# Patient Record
Sex: Female | Born: 1970 | Race: White | Hispanic: No | Marital: Single | Smoking: Never smoker
Health system: Southern US, Community
[De-identification: ages and names within clinical notes are randomized; demographics above are authoritative.]

## PROBLEM LIST (undated history)

## (undated) ENCOUNTER — Emergency Department (HOSPITAL_BASED_OUTPATIENT_CLINIC_OR_DEPARTMENT_OTHER): Admission: EM | Payer: BC Managed Care – PPO | Source: Home / Self Care

## (undated) DIAGNOSIS — T4145XA Adverse effect of unspecified anesthetic, initial encounter: Secondary | ICD-10-CM

## (undated) DIAGNOSIS — Z9889 Other specified postprocedural states: Secondary | ICD-10-CM

## (undated) DIAGNOSIS — Z789 Other specified health status: Secondary | ICD-10-CM

## (undated) DIAGNOSIS — R112 Nausea with vomiting, unspecified: Secondary | ICD-10-CM

---

## 2000-05-23 ENCOUNTER — Other Ambulatory Visit: Admission: RE | Admit: 2000-05-23 | Discharge: 2000-05-23 | Payer: Self-pay | Admitting: Obstetrics and Gynecology

## 2001-05-10 ENCOUNTER — Other Ambulatory Visit: Admission: RE | Admit: 2001-05-10 | Discharge: 2001-05-10 | Payer: Self-pay | Admitting: Obstetrics and Gynecology

## 2002-05-27 ENCOUNTER — Encounter: Payer: Self-pay | Admitting: Obstetrics and Gynecology

## 2002-05-27 ENCOUNTER — Inpatient Hospital Stay (HOSPITAL_COMMUNITY): Admission: AD | Admit: 2002-05-27 | Discharge: 2002-05-27 | Payer: Self-pay | Admitting: Obstetrics and Gynecology

## 2002-06-14 ENCOUNTER — Inpatient Hospital Stay (HOSPITAL_COMMUNITY): Admission: AD | Admit: 2002-06-14 | Discharge: 2002-06-17 | Payer: Self-pay | Admitting: Obstetrics and Gynecology

## 2002-07-26 ENCOUNTER — Other Ambulatory Visit: Admission: RE | Admit: 2002-07-26 | Discharge: 2002-07-26 | Payer: Self-pay | Admitting: Obstetrics and Gynecology

## 2002-08-08 ENCOUNTER — Encounter: Admission: RE | Admit: 2002-08-08 | Discharge: 2002-08-08 | Payer: Self-pay | Admitting: Urology

## 2002-08-08 ENCOUNTER — Encounter: Payer: Self-pay | Admitting: Urology

## 2002-09-02 ENCOUNTER — Encounter: Payer: Self-pay | Admitting: Urology

## 2002-09-02 ENCOUNTER — Encounter: Admission: RE | Admit: 2002-09-02 | Discharge: 2002-09-02 | Payer: Self-pay | Admitting: Urology

## 2003-06-18 ENCOUNTER — Other Ambulatory Visit: Admission: RE | Admit: 2003-06-18 | Discharge: 2003-06-18 | Payer: Self-pay | Admitting: Obstetrics and Gynecology

## 2003-07-15 ENCOUNTER — Emergency Department (HOSPITAL_COMMUNITY): Admission: AC | Admit: 2003-07-15 | Discharge: 2003-07-15 | Payer: Self-pay

## 2003-07-19 ENCOUNTER — Emergency Department (HOSPITAL_COMMUNITY): Admission: AD | Admit: 2003-07-19 | Discharge: 2003-07-19 | Payer: Self-pay | Admitting: Family Medicine

## 2004-09-05 DIAGNOSIS — T8859XA Other complications of anesthesia, initial encounter: Secondary | ICD-10-CM

## 2004-09-05 HISTORY — DX: Other complications of anesthesia, initial encounter: T88.59XA

## 2004-11-23 ENCOUNTER — Ambulatory Visit (HOSPITAL_COMMUNITY): Admission: RE | Admit: 2004-11-23 | Discharge: 2004-11-23 | Payer: Self-pay | Admitting: Obstetrics and Gynecology

## 2004-12-13 ENCOUNTER — Other Ambulatory Visit: Admission: RE | Admit: 2004-12-13 | Discharge: 2004-12-13 | Payer: Self-pay | Admitting: Obstetrics and Gynecology

## 2005-06-30 ENCOUNTER — Inpatient Hospital Stay (HOSPITAL_COMMUNITY): Admission: AD | Admit: 2005-06-30 | Discharge: 2005-07-03 | Payer: Self-pay | Admitting: Obstetrics and Gynecology

## 2005-06-30 ENCOUNTER — Encounter (INDEPENDENT_AMBULATORY_CARE_PROVIDER_SITE_OTHER): Payer: Self-pay | Admitting: *Deleted

## 2005-07-07 ENCOUNTER — Inpatient Hospital Stay (HOSPITAL_COMMUNITY): Admission: AD | Admit: 2005-07-07 | Discharge: 2005-07-07 | Payer: Self-pay | Admitting: Obstetrics and Gynecology

## 2005-08-18 ENCOUNTER — Ambulatory Visit (HOSPITAL_COMMUNITY): Admission: RE | Admit: 2005-08-18 | Discharge: 2005-08-18 | Payer: Self-pay | Admitting: Obstetrics and Gynecology

## 2005-10-05 IMAGING — US US OB TRANSVAGINAL MODIFY
1 series · 14 of 28 positions shown · non-contrast
Comparison: none

CLINICAL DATA: G3 P1 TAB1.  LMP 10/04/04.  Spotting.  Non-reassuring quantitative beta hCG levels.  
 EARLY OBSTETRICAL ULTRASOUND WITH TRANSVAGINAL:  
 Transabdominal and transvaginal scanning of the pelvis was performed. 
 There is an intrauterine gestational sac containing a single living embryo with a regular heart rate of 126 bpm.  By crown rump length, the gestation is estimated at 6 weeks 4 days.  This is 4 days behind LMP dating.  The measurement variation at this gestational age is + / - 4 days.  Yolk sac is visualized.  No subchorionic hemorrhage is noted.
 Ovaries are normal.  No adnexal mass or free pelvic fluid is noted.

[Series 1: us ob transvaginal modify · 0.27mm/px · 14 of 40 slices shown]
[im 2/40]
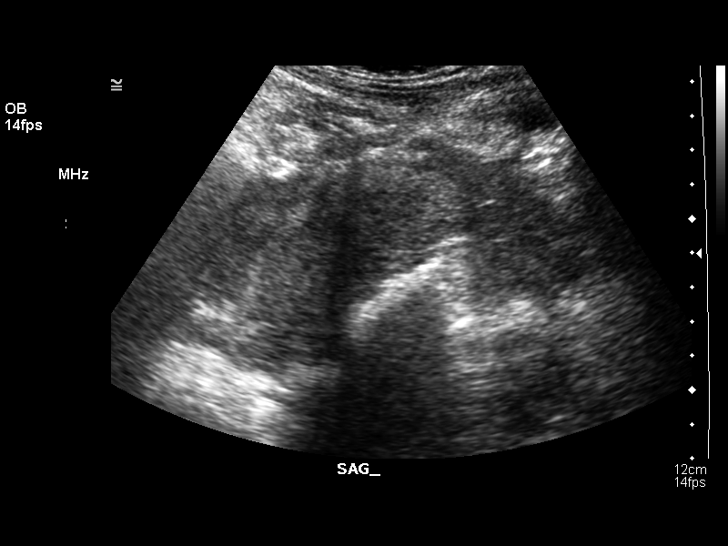
[im 5/40]
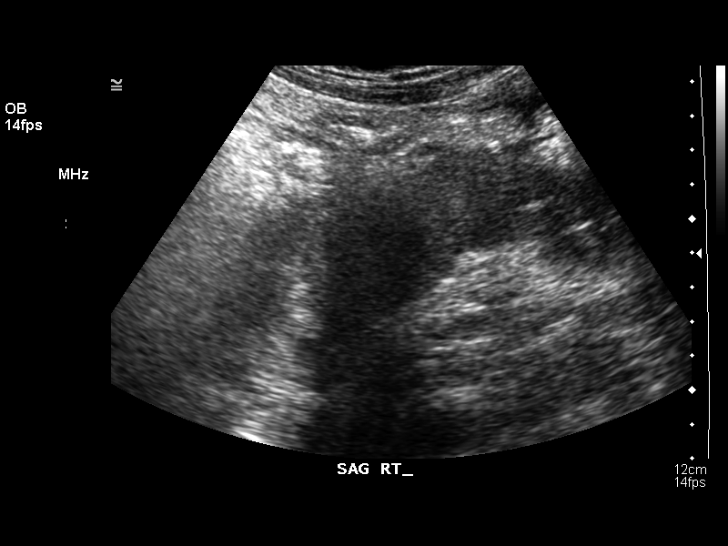
[im 8/40]
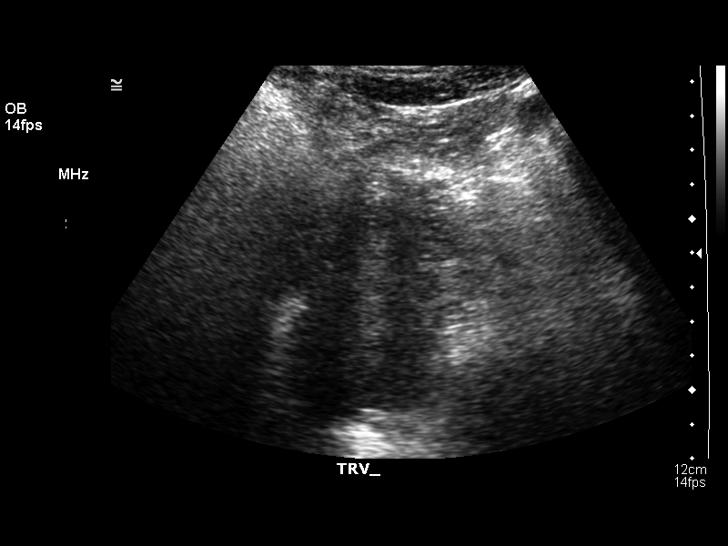
[im 11/40]
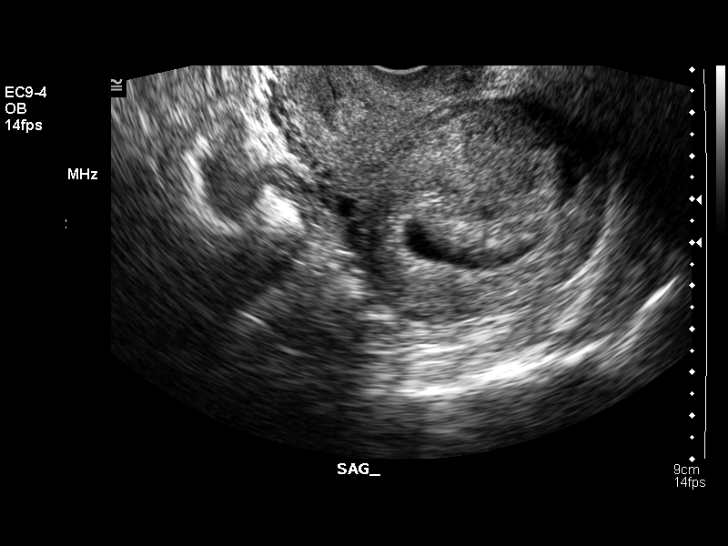
[im 14/40]
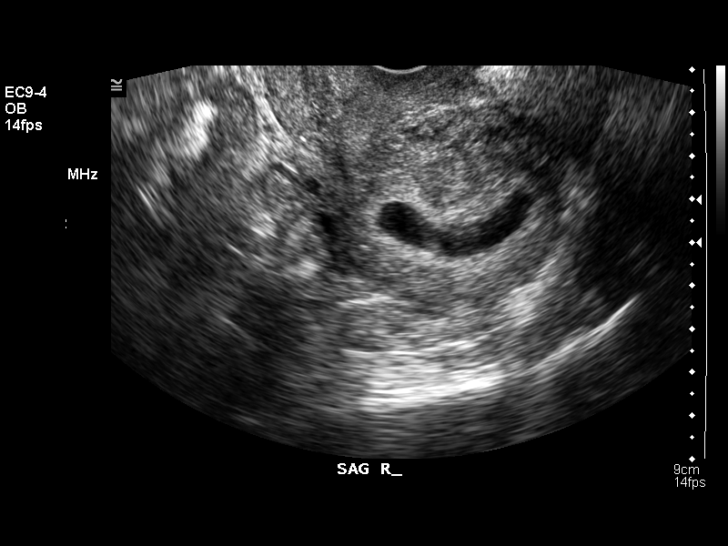
[im 16/40]
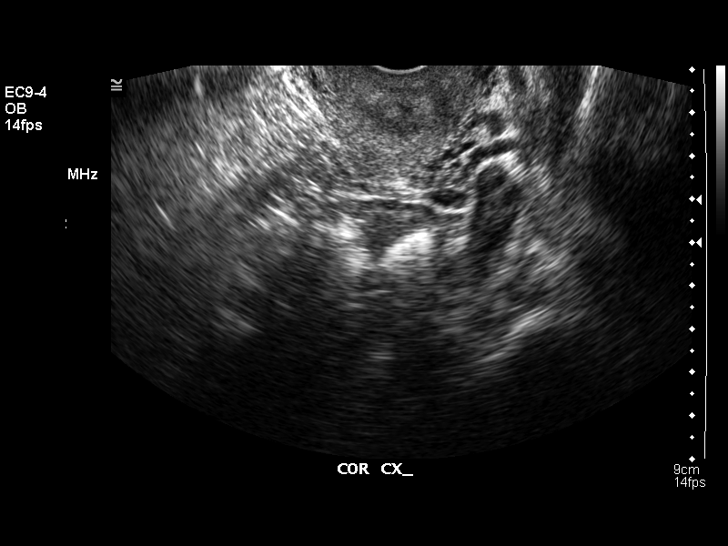
[im 19/40]
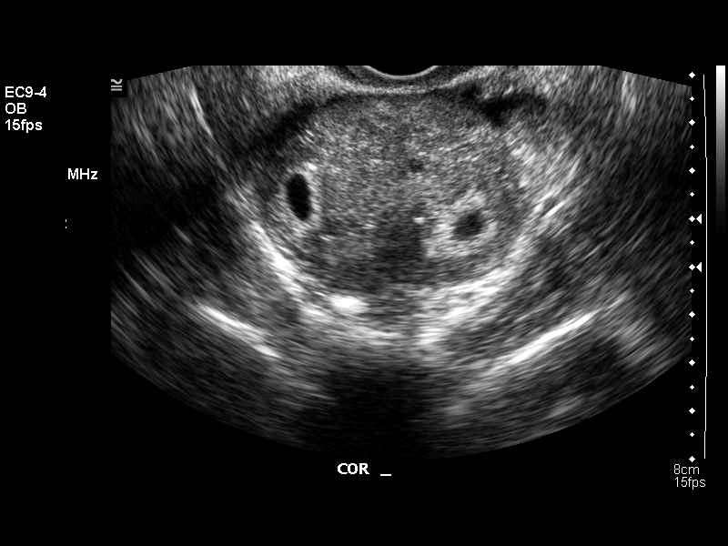
[im 22/40]
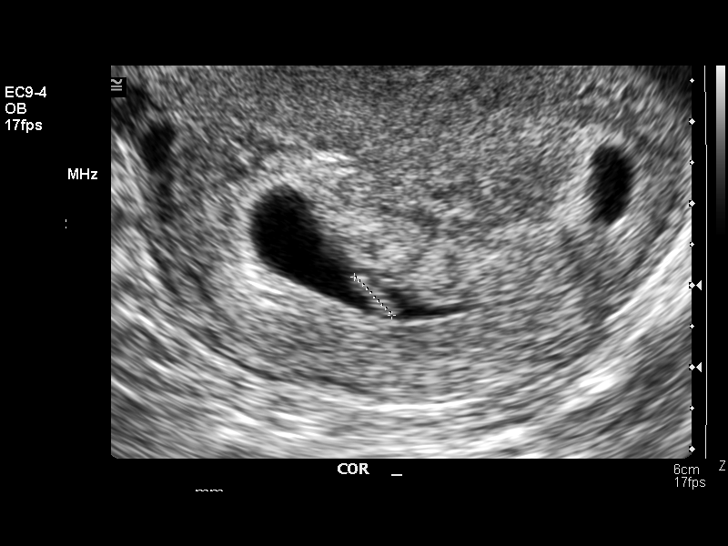
[im 25/40]
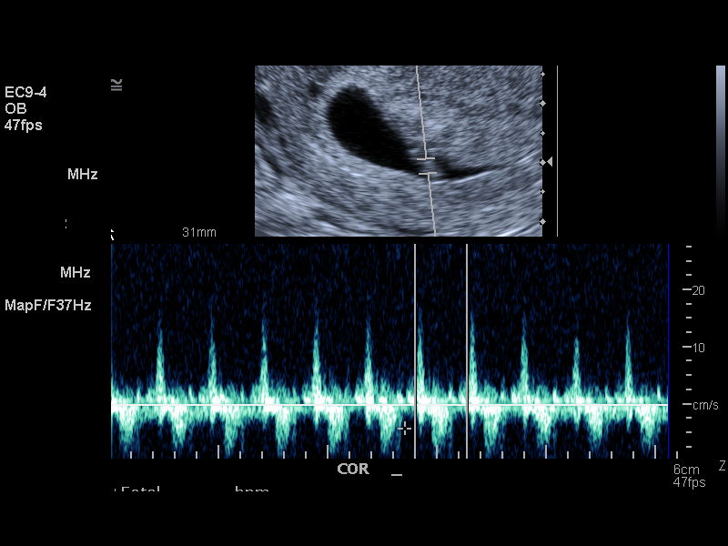
[im 28/40]
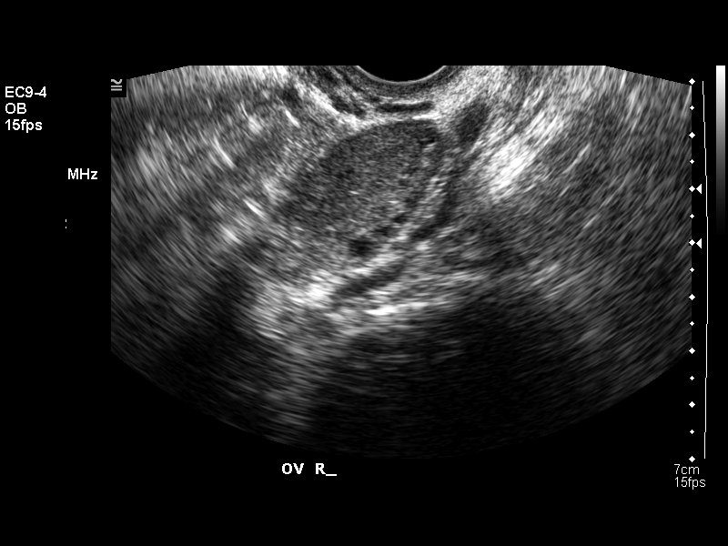
[im 31/40]
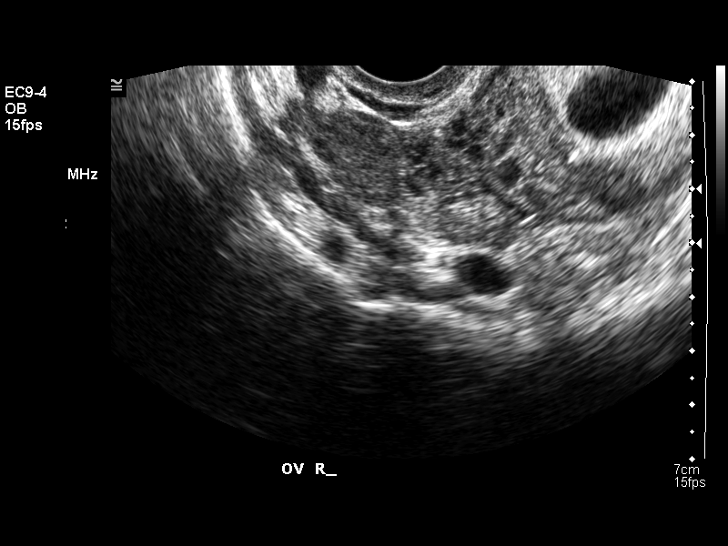
[im 34/40]
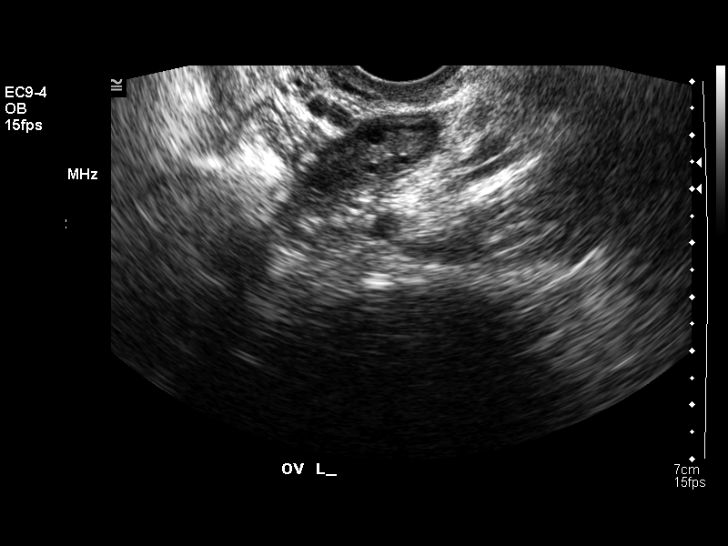
[im 37/40]
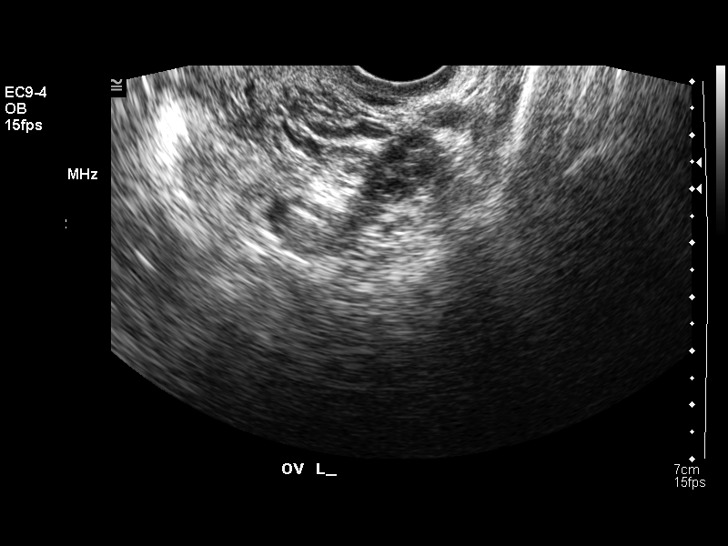
[im 40/40]
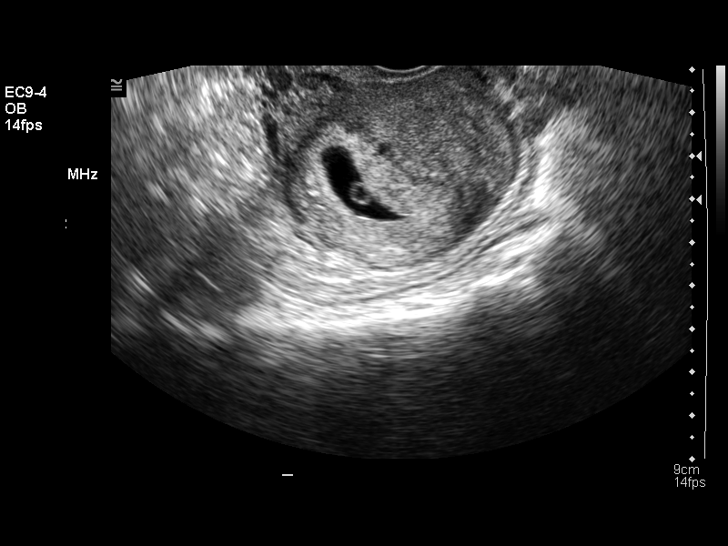

[14 of 28 positions shown; findings below may reference images not displayed]

IMPRESSION: Single living intrauterine embryo.  Patient is 7 weeks 1 day by LMP dating and measuring 6 weeks 4 days today which is within the measurement variation at this gestational age.

## 2006-06-30 IMAGING — US US ABDOMEN COMPLETE
1 series · 13 of 25 positions shown · non-contrast
Comparison: none

CLINICAL DATA: Evaluate gallbladder.  Right upper quadrant pain.  Six weeks post-partum with a history of preeclampsia.  
 ABDOMEN ULTRASOUND:
TECHNIQUE: Complete abdominal ultrasound examination was performed including evaluation of the liver, gallbladder, bile ducts, pancreas, kidneys, spleen, IVC, and abdominal aorta.
 The liver is homogeneous in echotexture without evidence for focal parenchymal abnormality or intrahepatic ductal dilatation is seen.  The gallbladder is well-distended and shows no evidence for intraluminal stones or sludge.  No pericholecystic fluid or gallbladder wall thickening is seen.  The common bile duct measures 0.7 mm in AP width and this is within normal limits for size.
 The pancreas is seen in its entirety and is normal in size and echotexture.  
 Both kidneys are well visualized with the right kidney having a sagittal length of 10.3 cm and the left kidney having a sagittal length of 11.0 cm.  No focal parenchymal abnormalities or signs of hydronephrosis are seen.  No perinephric fluid is noted.    The spleen has a normal ultrasound appearance.  
 The aorta is normal in caliber and the proximal portion of the inferior vena cava is unremarkable.  No intraabdominal fluid or adenopathy is seen.

[Series 1: us abdomen complete · 0.35mm/px · 13 of 67 slices shown]
[im 1/67]
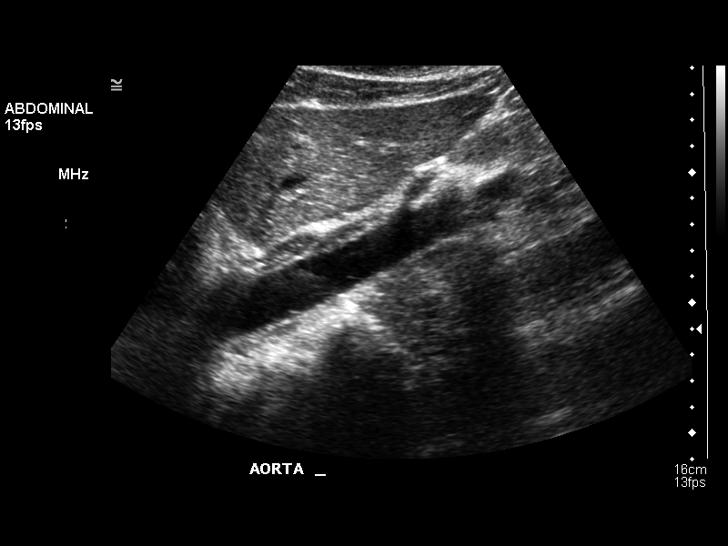
[im 6/67]
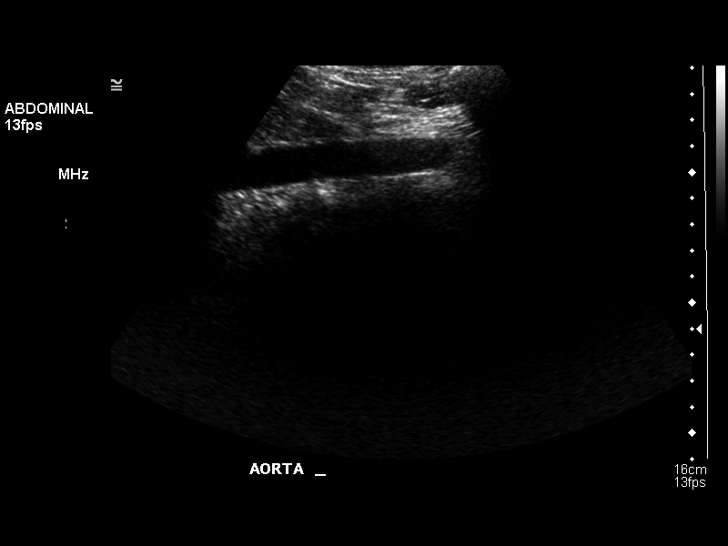
[im 12/67]
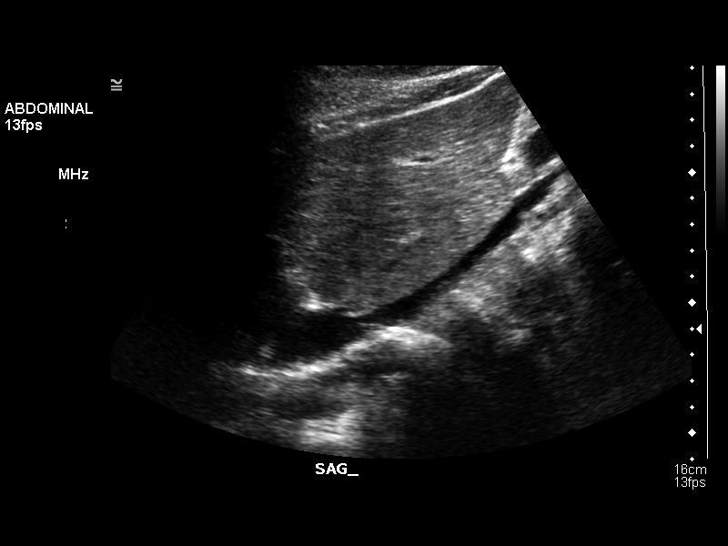
[im 17/67]
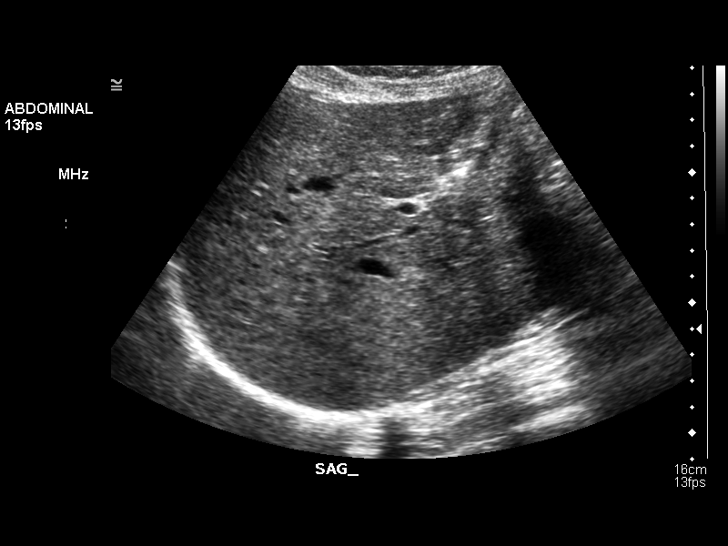
[im 23/67]
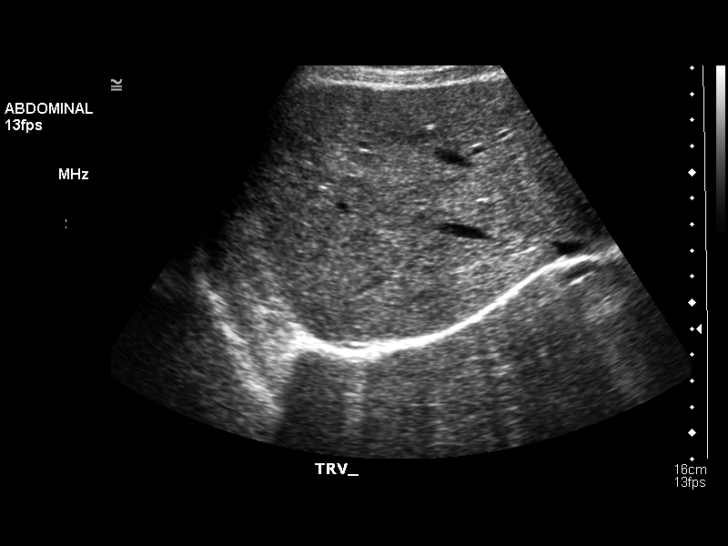
[im 28/67]
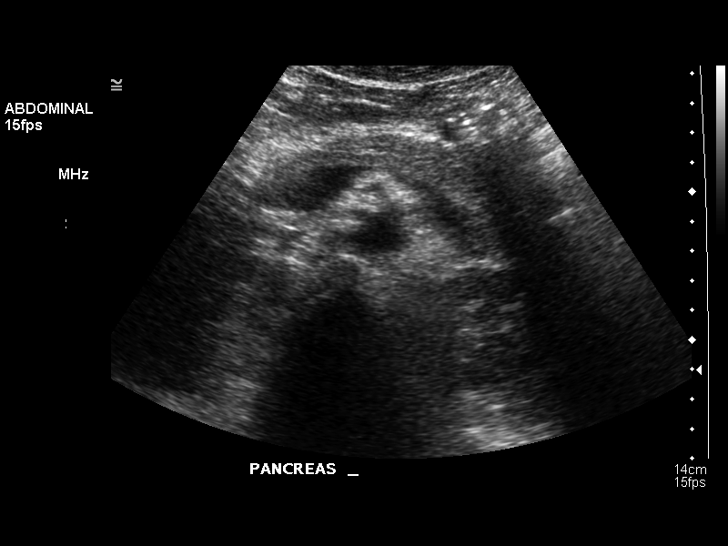
[im 34/67]
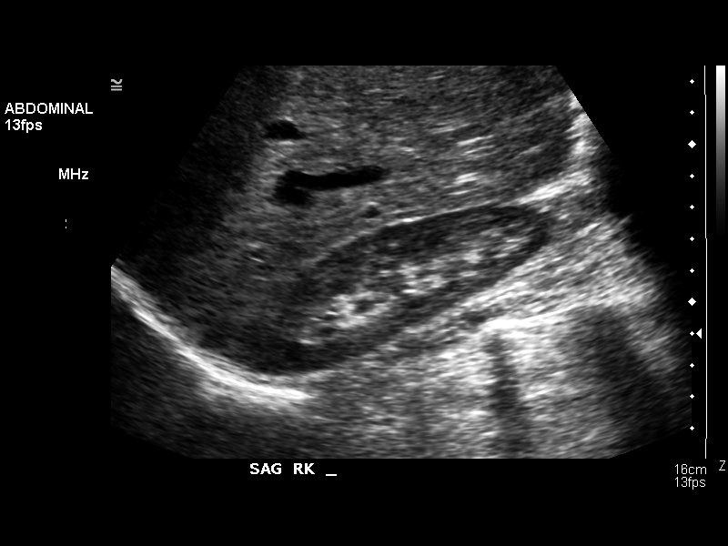
[im 39/67]
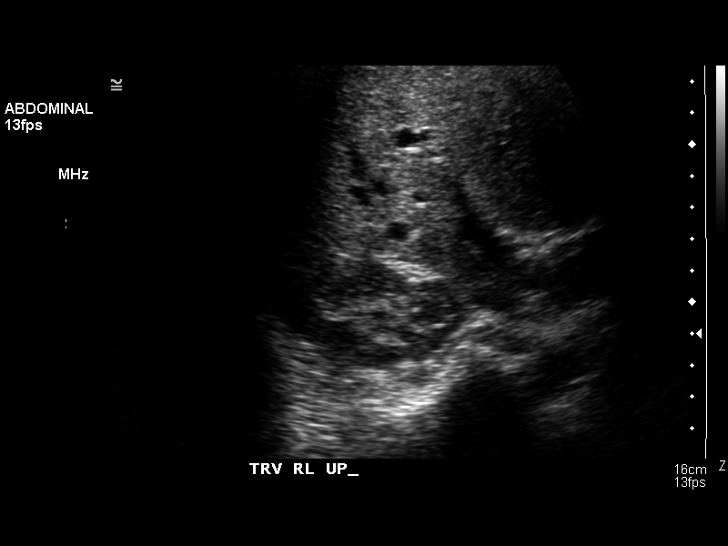
[im 45/67]
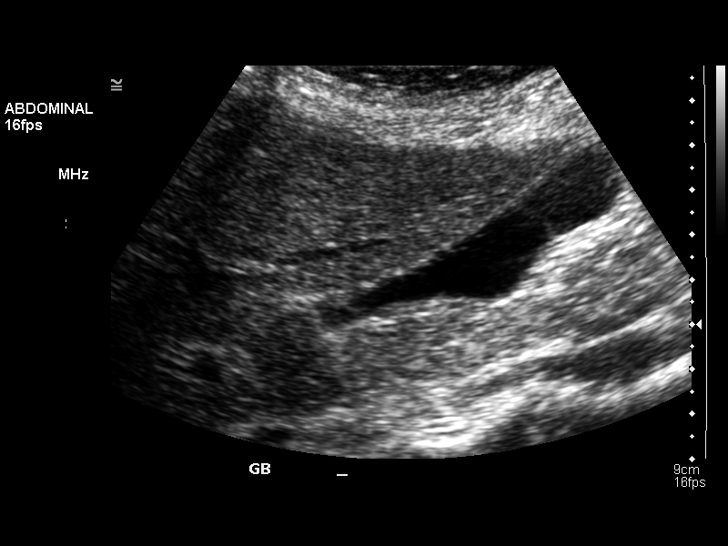
[im 50/67]
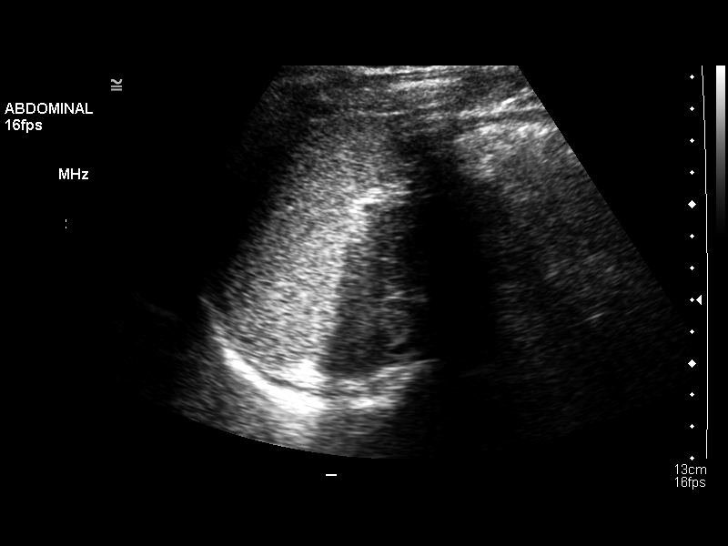
[im 56/67]
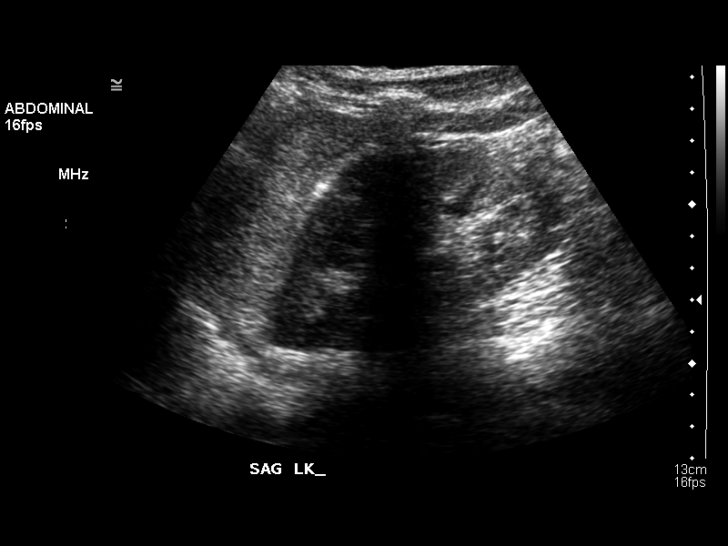
[im 61/67]
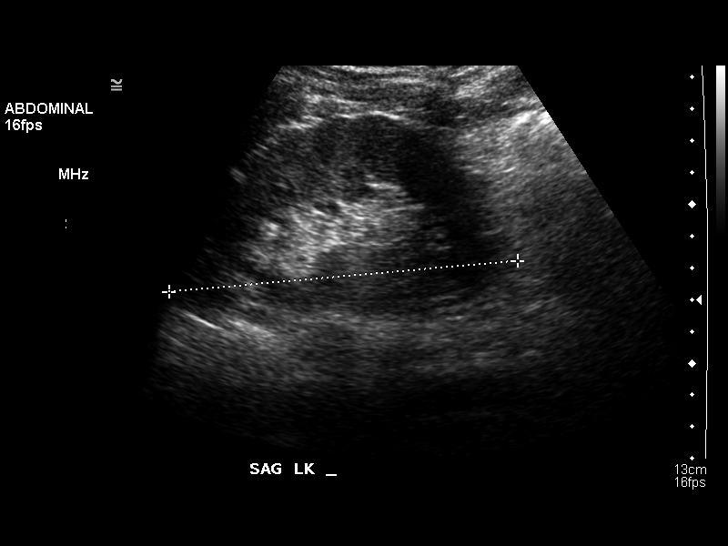
[im 67/67]
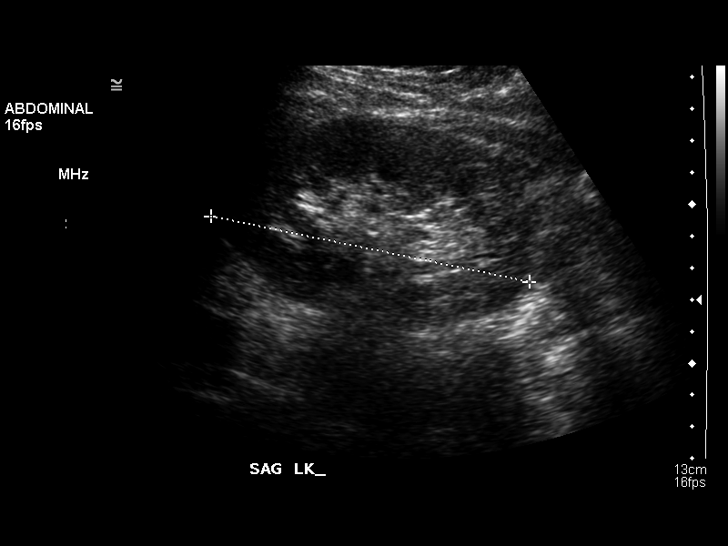

[13 of 25 positions shown; findings below may reference images not displayed]

IMPRESSION: Normal abdominal ultrasound.

## 2011-01-21 NOTE — Discharge Summary (Signed)
NAMEMICHA, ERCK NO.:  [id number]   MEDICAL RECORD NO.:  [id number]          PATIENT TYPE:  INP   LOCATION:  9124                          FACILITY:  [facility]   PHYSICIAN:  Janine Limbo, M.D.DATE OF BIRTH:  Aug 10, 1971   DATE OF ADMISSION:  06/30/2005  DATE OF DISCHARGE:  07/03/2005                                 DISCHARGE SUMMARY   ADMISSION DIAGNOSES:  1.  Intrauterine pregnancy at term.  2.  Preeclampsia.  3.  Prior C-section, desires sterilization.   PROCEDURE:  Repeat low transverse cesarean section and bilateral tubal  sterilization.   DISCHARGE DIAGNOSIS:  1.  Intrauterine pregnancy at term delivered.  2.  Preeclampsia.  3.  Repeat low transverse cesarean section.  4.  Bilateral tubal sterilization.  Debra [name] is a 40 year old gravida 3, para 0,1,1,1 who was admitted a 68  week's gestation for repeat cesarean delivery secondary to preeclampsia. One  day prior to her previously scheduled repeat C-section. She underwent repeat  low-transverse were cesarean section on 06/30/2005 with Dr. Dierdre Forth  as surgeon. She gave birth to an 8 pound, 13 ounce female infant named Davine,  Apgar scores of eight at 1 minute, 5 in 5 minutes. The patient has done well  postoperatively. On admission her platelets were decreased to 80,000.  Postoperative day 3, her platelets were 184,000. She was admitted with  elevated liver function tests with SGOT 68 and SGPT 79. On her first  postoperative day these increased to 219. Her SGOT  eight was 178, her SGPT  on July 03, 2005, day of discharge, had decreased to 30 which is in  normal range and her SGPT had decreased to 67. Her vital signs have remained  stable. Her blood pressure has been 116-144 over 63-83. Her orthostatic  vital signs have remained stable. Her weight today on July 03, 2005 is  decreased to 148 from 150 at 5:30 yesterday afternoon. Her hemoglobin has  remained stable postoperatively at  8.9-8.6 and the patient received  magnesium sulfate for greater than 24 hours postoperatively. Her magnesium  sulfate was discontinued yesterday and she was released to a normal  postpartum floor. She has done well since that time, she is up ambulating  without difficulty. No dizziness or syncope. She denies any headache, visual  changes or epigastric pain. Her abdomen is soft and nontender with positive  bowel sounds. Her incision with staples was clean, dry and intact and on  this her third postoperative day she is judged to be in satisfactory  condition for discharge.   DISCHARGE INSTRUCTIONS:  Per [facility] handout.   DISCHARGE MEDICATIONS:  1.  Motrin 600 mg p.o. q.6 h p.r.n. pain.  2.  Tylox one to two p.o. q.3-4 h pain.  3.  Prenatal vitamins.      Debra [name], C.N.M.      Janine Limbo, M.D.  Electronically Signed    SDM/MEDQ  D:  07/03/2005  T:  07/03/2005  Job:  [id number]

## 2011-01-21 NOTE — Op Note (Signed)
[name], [name] NO.:  [id number]   MEDICAL RECORD NO.:  [id number]          PATIENT TYPE:  INP   LOCATION:  9374                          FACILITY:  [facility]   PHYSICIAN:  Hal Morales, M.D.DATE OF BIRTH:  05-Apr-1971   DATE OF PROCEDURE:  06/30/2005  DATE OF DISCHARGE:                                 OPERATIVE REPORT   PREOPERATIVE DIAGNOSIS:  Intrauterine pregnancy at term, preeclampsia, prior  cesarean section with desire for repeat cesarean section, desire for  surgical sterilization.   POSTOPERATIVE DIAGNOSES:  Intrauterine pregnancy at term, preeclampsia,  prior cesarean section with desire for repeat cesarean section, desire for  surgical sterilization.   OPERATION:  Repeat low transverse cesarean section and bilateral tubal  sterilization.   SURGEON:  Hal Morales, M.D.   FIRST ASSISTANT:  Wynelle Bourgeois, certified nurse midwife   ANESTHESIA:  Spinal.   ESTIMATED BLOOD LOSS:  1000 mL.   COMPLICATIONS:  None.   FINDINGS:  The uterus, tubes and ovaries were normal for the gravid state.  The patient was delivered of a female infant whose name is a Debra [name] weighing 8  pounds 13 ounces with Apgars of 8 and 9 at 1 and 5 minutes respectively.   PROCEDURE:  The patient was taken to the operating room after appropriate  identification and placed on the operating table. After the placement of  spinal anesthetic she was placed in the supine position with a left lateral  tilt. The abdomen and perineum were prepped with multiple layers of Betadine  and a Foley catheter inserted into the bladder and connected to straight  drainage. The abdomen was draped as a sterile field. After assurance of  adequate anesthesia, the suprapubic region at the site of previous cesarean  section incision was infiltrated with approximately 15 mL of 0.25% Marcaine.  Suprapubic incision was made and the abdomen opened in layers. Peritoneum  was entered and the bladder  blade placed. The uterine serosa was incised  approximately 2 cm above the uterovesical fold and the bladder flap  advanced. The uterus was incised in the midline and that incision taken  laterally on either side bluntly. The infant was delivered from the occiput  transverse position with the aid of a kiwi vacuum extractor. The nares and  pharynx suctioned. Cord clamped and cut. The infant was handed off to the  awaiting pediatricians. The appropriate cord blood was drawn and placenta  noted to have separated from the uterus and was removed from the operative  field. Uterine incision was closed with running interlocking suture of 0  Vicryl. An imbricating suture of 0 Vicryl was then placed. Fundal massage  allowed for firming of the uterus and decreased bleeding. The left fallopian  tube was identified, followed to its fimbriated end, then grasped at the  isthmic portion and elevated. A suture of 2-0 chromic was placed through the  mesosalpinx and tied fore and aft on the knuckle of tube. A suture ligature  was placed. This placed proximal to that and the intervening knuckle of tube  excised. The cut ends were cauterized. A similar  procedure was carried out  on the opposite side. Hemostasis was noted to be adequate. Copious  irrigation was carried out and the abdominal peritoneum closed with a  running suture of 2-0 Vicryl. The rectus muscles were reapproximated in  midline with figure-of-eight suture of 2-0 Vicryl. The rectus fascia was  closed with running suture of 0 Vicryl and reinforced on either side of  midline with figure-of-eight sutures of 0 Vicryl. Subcutaneous tissue was  made hemostatic with Bovie cautery. Skin staples were applied to the skin  incision. A sterile dressing was applied and the patient taken from the  operating room to the recovery room in satisfactory condition having  tolerated the procedure well with sponge and instrument counts correct.   SPECIMENS TO  PATHOLOGY:  Portions of the right and left tube.      Hal Morales, M.D.  Electronically Signed     VPH/MEDQ  D:  06/30/2005  T:  07/01/2005  Job:  [id number]
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
## 2011-03-22 ENCOUNTER — Other Ambulatory Visit: Payer: Self-pay | Admitting: Obstetrics and Gynecology

## 2011-03-22 DIAGNOSIS — N644 Mastodynia: Secondary | ICD-10-CM

## 2011-03-28 ENCOUNTER — Ambulatory Visit
Admission: RE | Admit: 2011-03-28 | Discharge: 2011-03-28 | Disposition: A | Discharge status: Home / Self Care | Payer: 59 | Source: Ambulatory Visit | Attending: Obstetrics and Gynecology | Admitting: Obstetrics and Gynecology

## 2011-03-28 ENCOUNTER — Other Ambulatory Visit: Payer: Self-pay | Admitting: Obstetrics and Gynecology

## 2011-03-28 DIAGNOSIS — N644 Mastodynia: Secondary | ICD-10-CM

## 2012-07-01 ENCOUNTER — Encounter (HOSPITAL_BASED_OUTPATIENT_CLINIC_OR_DEPARTMENT_OTHER): Payer: Self-pay

## 2012-07-01 ENCOUNTER — Emergency Department (HOSPITAL_BASED_OUTPATIENT_CLINIC_OR_DEPARTMENT_OTHER)
Admission: EM | Admit: 2012-07-01 | Discharge: 2012-07-01 | Disposition: A | Discharge status: Home / Self Care | Payer: 59 | Attending: Emergency Medicine | Admitting: Emergency Medicine

## 2012-07-01 DIAGNOSIS — Y9289 Other specified places as the place of occurrence of the external cause: Secondary | ICD-10-CM | POA: Insufficient documentation

## 2012-07-01 DIAGNOSIS — IMO0002 Reserved for concepts with insufficient information to code with codable children: Secondary | ICD-10-CM | POA: Insufficient documentation

## 2012-07-01 DIAGNOSIS — Y9389 Activity, other specified: Secondary | ICD-10-CM | POA: Insufficient documentation

## 2012-07-01 DIAGNOSIS — T192XXA Foreign body in vulva and vagina, initial encounter: Secondary | ICD-10-CM

## 2012-07-01 LAB — WET PREP, GENITAL
Trich, Wet Prep: NONE SEEN
Yeast Wet Prep HPF POC: NONE SEEN

## 2012-07-01 NOTE — ED Provider Notes (Addendum)
History     CSN: [id number]  Arrival date & time 07/01/12  8295   First MD Initiated Contact with Patient 07/01/12 [phone]      No chief complaint on file.   (Consider location/radiation/quality/duration/timing/severity/associated sxs/prior treatment) Patient is a 41 y.o. female presenting with foreign body in vagina. The history is provided by the patient.  Foreign Body in Vagina This is a new problem. The current episode started less than 1 hour ago. The problem occurs constantly. The problem has not changed since onset.Pertinent negatives include no chest pain, no abdominal pain, no headaches and no shortness of breath. Nothing aggravates the symptoms. Nothing relieves the symptoms. She has tried nothing for the symptoms. The treatment provided no relief.  Thinks the condom is still in the vagina post intercourse.    History reviewed. No pertinent past medical history.  History reviewed. No pertinent past surgical history.  No family history on file.  History  Substance Use Topics  . Smoking status: Not on file  . Smokeless tobacco: Not on file  . Alcohol Use: Not on file    OB History    Grav Para Term Preterm Abortions TAB SAB Ect Mult Living                  Review of Systems  Respiratory: Negative for shortness of breath.   Cardiovascular: Negative for chest pain.  Gastrointestinal: Negative for abdominal pain.  Genitourinary: Negative for dysuria and vaginal discharge.  Neurological: Negative for headaches.  All other systems reviewed and are negative.    Allergies  Review of patient's allergies indicates no known allergies.  Home Medications  No current outpatient prescriptions on file.  BP 131/118  Pulse 76  Temp 98 F (36.7 C) (Oral)  Resp 20  SpO2 99%  Physical Exam  Constitutional: She is oriented to person, place, and time. She appears well-developed and well-nourished. No distress.  HENT:  Head: Normocephalic and atraumatic.  Eyes: EOM are  normal.  Cardiovascular: Normal rate and regular rhythm.   Pulmonary/Chest: Effort normal and breath sounds normal. She has no wheezes. She has no rales.  Abdominal: Soft. Bowel sounds are normal.  Genitourinary: No vaginal discharge found.       Foreign body consistent with condom present in vaginal vault.  Chaperone present  Musculoskeletal: Normal range of motion.  Neurological: She is alert and oriented to person, place, and time.  Skin: Skin is warm and dry.  Psychiatric: She has a normal mood and affect.    ED Course  FOREIGN BODY REMOVAL Date/Time: 07/01/2012 3:41 AM Performed by: Jasmine Awe Authorized by: Jasmine Awe Consent: Verbal consent obtained. Body area: vagina Patient sedated: no Localization method: visualized and speculum Removal mechanism: ring forceps Complexity: simple 1 objects recovered. Objects recovered: condome Post-procedure assessment: foreign body removed Patient tolerance: Patient tolerated the procedure well with no immediate complications.   (including critical care time)  Labs Reviewed - No data to display No results found.   No diagnosis found.  Tubal ligation LMP 06/21/12  MDM  Patient requested STI testing which was performed.  Patient will be called with results if positive.         Jasmine Awe, MD 07/01/12 0358  Basil Blakesley K Shaelee Forni-Rasch, MD 07/01/12 [phone]

## 2012-07-02 LAB — GC/CHLAMYDIA PROBE AMP, GENITAL
Chlamydia, DNA Probe: NEGATIVE
GC Probe Amp, Genital: NEGATIVE

## 2012-08-17 ENCOUNTER — Telehealth: Payer: Self-pay | Admitting: Obstetrics and Gynecology

## 2012-08-20 ENCOUNTER — Encounter: Payer: Self-pay | Admitting: Obstetrics and Gynecology

## 2012-08-20 ENCOUNTER — Ambulatory Visit (INDEPENDENT_AMBULATORY_CARE_PROVIDER_SITE_OTHER): Payer: 59 | Admitting: Obstetrics and Gynecology

## 2012-08-20 VITALS — BP 110/72 | Temp 97.9°F | Wt 126.0 lb

## 2012-08-20 DIAGNOSIS — N898 Other specified noninflammatory disorders of vagina: Secondary | ICD-10-CM

## 2012-08-20 DIAGNOSIS — Z113 Encounter for screening for infections with a predominantly sexual mode of transmission: Secondary | ICD-10-CM

## 2012-08-20 DIAGNOSIS — N899 Noninflammatory disorder of vagina, unspecified: Secondary | ICD-10-CM

## 2012-08-20 MED ORDER — METRONIDAZOLE 500 MG PO TABS: 500.0000 mg | ORAL_TABLET | Freq: Two times a day (BID) | ORAL | Status: DC

## 2012-08-20 MED ORDER — NONFORMULARY OR COMPOUNDED ITEM: 600.0000 mg | Status: DC

## 2012-08-20 NOTE — Progress Notes (Signed)
HISTORY OF PRESENT ILLNESS  Debra [name] is a 41 y.o. year old female,No obstetric history on file., who presents for a problem visit.   Subjective:  The patient complains of vaginal odor.  She wants to rule out sexually transmitted infections.  Objective:  BP 110/72  Temp 97.9 F (36.6 C) (Oral)  Wt 126 lb (57.153 kg)  LMP 08/10/2012   General: alert Resp: clear to auscultation bilaterally Cardio: regular rate and rhythm, S1, S2 normal, no murmur, click, rub or gallop GI: soft and nontender  External genitalia: normal general appearance Vaginal: normal without tenderness, induration or masses Cervix: normal appearance Adnexa: normal bimanual exam Uterus: nontender, normal size  Wet prep: Positive clue cells  Assessment:  Bacterial vaginosis  Rule out sexual transmitted infections  Plan:  RPR, GC, Chlamydia  Metronidazole 500 mg twice each day for 7 days  Return to office prn if symptoms worsen or fail to improve.  Needs annual exam.   Leonard Schwartz M.D.  08/21/2012 11:22 PM    Color: None Odor: yes Itching:yes Thin:no Thick:no Fever:yes "Recently" Dyspareunia:no Hx PID:no HX STD:yes Pelvic Pain:no Desires Gc/CT:yes Desires HIV,RPR,HbsAG:yes "RPR"  Pt stated she had to go to the ER before for a condom being stuck on  07/01/2012.

## 2012-10-09 ENCOUNTER — Encounter: Payer: Self-pay | Admitting: Obstetrics and Gynecology

## 2012-10-09 ENCOUNTER — Ambulatory Visit: Payer: 59 | Admitting: Obstetrics and Gynecology

## 2012-10-09 VITALS — BP 118/78 | Resp 16 | Ht 66.0 in | Wt 125.0 lb

## 2012-10-09 DIAGNOSIS — N926 Irregular menstruation, unspecified: Secondary | ICD-10-CM

## 2012-10-09 DIAGNOSIS — R102 Pelvic and perineal pain: Secondary | ICD-10-CM

## 2012-10-09 DIAGNOSIS — Z1231 Encounter for screening mammogram for malignant neoplasm of breast: Secondary | ICD-10-CM

## 2012-10-09 DIAGNOSIS — Z01419 Encounter for gynecological examination (general) (routine) without abnormal findings: Secondary | ICD-10-CM

## 2012-10-09 DIAGNOSIS — Z124 Encounter for screening for malignant neoplasm of cervix: Secondary | ICD-10-CM

## 2012-10-09 DIAGNOSIS — N390 Urinary tract infection, site not specified: Secondary | ICD-10-CM

## 2012-10-09 DIAGNOSIS — Z139 Encounter for screening, unspecified: Secondary | ICD-10-CM

## 2012-10-09 DIAGNOSIS — N898 Other specified noninflammatory disorders of vagina: Secondary | ICD-10-CM

## 2012-10-09 LAB — POCT URINALYSIS DIPSTICK
Bilirubin, UA: NEGATIVE
Glucose, UA: NEGATIVE
Leukocytes, UA: NEGATIVE
Nitrite, UA: NEGATIVE
Urobilinogen, UA: NEGATIVE

## 2012-10-09 MED ORDER — SULFAMETHOXAZOLE-TRIMETHOPRIM 800-160 MG PO TABS: 1.0000 | ORAL_TABLET | Freq: Two times a day (BID) | ORAL | Status: AC

## 2012-10-09 MED ORDER — NONFORMULARY OR COMPOUNDED ITEM: 600.0000 mg | Status: DC

## 2012-10-09 MED ORDER — METRONIDAZOLE 500 MG PO TABS: 500.0000 mg | ORAL_TABLET | Freq: Two times a day (BID) | ORAL | Status: DC

## 2012-10-09 MED ORDER — SULFAMETHOXAZOLE-TRIMETHOPRIM 800-160 MG PO TABS: 1.0000 | ORAL_TABLET | Freq: Two times a day (BID) | ORAL | Status: DC

## 2012-10-09 NOTE — Progress Notes (Addendum)
ANNUAL GYNECOLOGIC EXAMINATION   Debra [name] is a 42 y.o. female, No obstetric history on file., who presents for an annual exam. The patient complains of dysuria and pelvic discomfort.  She reports that she had prolonged spotting after her last cycle.  She has had a tubal ligation.  She is going through a divorce.  She has a past history of herpes. She had a negative gonorrhea, Chlamydia, and serology in December 2013. She has rare outbreaks.    History   Social History  . Marital Status: Married    Spouse Name: N/A    Number of Children: N/A  . Years of Education: N/A   Social History Main Topics  . Smoking status: Never Smoker   . Smokeless tobacco: Never Used  . Alcohol Use: Yes     Comment: socially   . Drug Use: No  . Sexually Active: Yes -- Female partner(s)    Birth Control/ Protection: None   Other Topics Concern  . None   Social History Narrative  . None    Menstrual cycle:   LMP: Patient's last menstrual period was 10/01/2012.             The following portions of the patient's history were reviewed and updated as appropriate: allergies, current medications, past family history, past medical history, past social history, past surgical history and problem list.  Review of Systems Pertinent items are noted in HPI. Breast:Negative for breast lump,nipple discharge or nipple retraction Gastrointestinal: chronic constipation (1 bowel movement per week), change in bowel habits or rectal bleeding Urinary:dysuria   Objective:    BP 118/78  Resp 16  Ht 5\' 6"  (1.676 m)  Wt 125 lb (56.7 kg)  BMI 20.18 kg/m2  LMP 10/01/2012    Weight:  Wt Readings from Last 1 Encounters:  10/09/12 125 lb (56.7 kg)          BMI: Body mass index is 20.18 kg/(m^2).  General Appearance: Alert, appropriate appearance for age. No acute distress HEENT: Grossly normal Neck / Thyroid: Supple, no masses, nodes or enlargement Lungs: clear to auscultation bilaterally Back: No CVA  tenderness Breast Exam: No masses or nodes.No dimpling, nipple retraction or discharge. Cardiovascular: Regular rate and rhythm. S1, S2, no murmur Gastrointestinal: Soft, non-tender, no masses or organomegaly  ++++++++++++++++++++++++++++++++++++++++++++++++++++++++  Pelvic Exam: External genitalia: normal general appearance Vaginal: normal without tenderness, induration or masses. Relaxation: Yes Cervix: normal appearance Adnexa: normal bimanual exam Uterus: normal size, shape, and consistency Rectovaginal: normal rectal, no masses  ++++++++++++++++++++++++++++++++++++++++++++++++++++++++  Lymphatic Exam: Non-palpable nodes in neck, clavicular, axillary, or inguinal regions Neurologic: Normal speech, no tremor  Psychiatric: Alert and oriented, appropriate affect  UA: Negative.   Assessment:    Normal gyn exam   Overweight or obese: No   Pelvic relaxation: Yes  Dysuria  Irregular menstrual cycles  Herpes virus   Chronic constipation  Plan:    mammogram pap smear return annually or prn Contraception:bilateral tubal ligation   Urine culture sent  GYN ultrasound next visit.  May need endometrial biopsy.  Medications prescribed: Septra DS 1 tablet twice a day for 3 days  STD screen request: No   The updated Pap smear screening guidelines were discussed with the patient. The patient requested that I obtain a Pap smear: Yes.  Kegel exercises discussed: Yes.  Proper diet and regular exercise were reviewed.  Annual mammograms recommended starting at age 34. Proper breast care was discussed.  Screening colonoscopy is recommended beginning at age 37.  Regular health maintenance was reviewed.  Sleep hygiene was discussed.  Adequate calcium and vitamin D intake was emphasized.  Leonard Schwartz M.D.    Regular Periods: yes Mammogram: yes  Monthly Breast Ex.: yes Exercise: yes 4 days a week   Tetanus < 10 years: no Seatbelts: yes  NI. Bladder  Functn.: no "Urgency" Abuse at home: no  Daily BM's: no once a week  Stressful Work: yes  Healthy Diet: no Sigmoid-Colonoscopy: per pt never   Calcium: no Medical problems this year: UTI, vaginal spotting .    LAST PAP:07/2010  Contraception: none   Mammogram:  2013 WNL   PCP: [facility]   PMH: No Changes  FMH: No changes  Last Bone Scan: Per pt never

## 2012-10-10 LAB — PAP IG W/ RFLX HPV ASCU

## 2012-10-23 ENCOUNTER — Ambulatory Visit: Payer: 59 | Admitting: Obstetrics and Gynecology

## 2012-10-23 ENCOUNTER — Encounter: Payer: Self-pay | Admitting: Obstetrics and Gynecology

## 2012-10-23 ENCOUNTER — Ambulatory Visit: Payer: 59

## 2012-10-23 VITALS — BP 102/78 | Ht 66.0 in | Wt 124.0 lb

## 2012-10-23 DIAGNOSIS — N926 Irregular menstruation, unspecified: Secondary | ICD-10-CM

## 2012-10-23 DIAGNOSIS — R102 Pelvic and perineal pain: Secondary | ICD-10-CM

## 2012-10-23 MED ORDER — SULFAMETHOXAZOLE-TRIMETHOPRIM 800-160 MG PO TABS: 1.0000 | ORAL_TABLET | Freq: Two times a day (BID) | ORAL | Status: AC

## 2012-10-23 NOTE — Progress Notes (Addendum)
HISTORY OF PRESENT ILLNESS  Debra [name] is a 42 y.o. year old female,G2P0202, who presents for a problem visit. The patient reports that she had irregular bleeding.  Her STD screening was negative.  Her urine culture was negative.  Subjective:  The patient continues to complain of pelvic pressure.  Objective:  BP 102/78  Ht 5\' 6"  (1.676 m)  Wt 124 lb (56.246 kg)  BMI 20.02 kg/m2  LMP 10/01/2012   GI: soft and nontender back: No CVA tenderness  External genitalia: normal general appearance Vaginal: normal without tenderness, induration or masses Cervix: normal appearance Adnexa: normal bimanual exam Uterus: normal size shape and consistency  Ultrasound: Uterus measures 5.3 x 4.5 cm.  Ovaries normal.  Endometrium measures 5.80 mm.  ENDOMETRIAL BIOPSY PROCEDURE  The indications for the procedure were reviewed with the patient.  All her questions were answered.  A speculum exam was performed.  The cervix and vagina were prepped with multiple layers of Betadine.  Hurricaine gel was applied to the cervix.  A single-tooth tenaculum was required.  The uterus sounded to 7 centimeters.  An adequate sample was obtained.  The patient tolerated her procedure well.  The endometrial sample was sent to pathology.   Assessment:  Irregular menstrual cycle  Pelvic pressure symptoms  Plan:  Endometrial biopsy to pathology.  The patient will check her test results on the computer.  Septra DS 1 tablet twice a day for 3 days.  Return to office prn if symptoms worsen or fail to improve.   Leonard Schwartz M.D.  10/23/2012 7:58 PM

## 2012-10-25 ENCOUNTER — Telehealth: Payer: Self-pay | Admitting: Obstetrics and Gynecology

## 2012-10-25 NOTE — Telephone Encounter (Signed)
TC to pt regarding Endometrial Biopsy Results  Results are not back yet Pt made aware results will be back 7-10 business days.  [facility] CMA

## 2012-10-26 ENCOUNTER — Other Ambulatory Visit: Payer: Self-pay | Admitting: Obstetrics and Gynecology

## 2012-10-26 DIAGNOSIS — R102 Pelvic and perineal pain: Secondary | ICD-10-CM

## 2012-10-30 ENCOUNTER — Telehealth: Payer: Self-pay

## 2012-10-30 NOTE — Telephone Encounter (Signed)
Patient called her cell number. No answer. Message left endometrial biopsy is benign.  Dr. Stefano Gaul

## 2012-10-31 ENCOUNTER — Telehealth: Payer: Self-pay | Admitting: Obstetrics and Gynecology

## 2012-10-31 NOTE — Telephone Encounter (Signed)
Returned pt's call. States has received test results.States is still having  Spotting and pressure.  Questioning if surgery is still recommended or if there are other options. To send message to DR AVS.

## 2012-11-03 ENCOUNTER — Telehealth: Payer: Self-pay | Admitting: Obstetrics and Gynecology

## 2012-11-03 NOTE — Telephone Encounter (Signed)
I called the patient.  We discussed irregular bleeding.  Treatment options reviewed including medications and surgery.  Risks and benefits outlined. She will consider her options.  She will call without she wants to proceed.  Dr. Stefano Gaul

## 2012-11-08 ENCOUNTER — Ambulatory Visit: Payer: 59

## 2012-11-13 ENCOUNTER — Telehealth: Payer: Self-pay | Admitting: Obstetrics and Gynecology

## 2012-11-13 NOTE — Telephone Encounter (Signed)
TC to pt regarding Triage message. Pt not ava LMOVM for pt to call back. LC CMA   LC CMA

## 2012-11-13 NOTE — Telephone Encounter (Signed)
Pt called back stating in 2000-2001 she was taking Macrobid to help w/ pelvic pressure. Pt states she noticed relief w/ the Rx Pt c/o that after intercourse she has pressure  Pt denies bleeding, and pain in 2/10x Pt has been taking ASA x 2 days to help w/ pressure Pt wants to know can she be given Macrobid again.  Will route to AVS to address Pt made aware Rx request are 72 hours for a response.  [facility] CMA

## 2012-11-16 ENCOUNTER — Telehealth: Payer: Self-pay

## 2012-11-16 ENCOUNTER — Other Ambulatory Visit: Payer: Self-pay

## 2012-11-16 MED ORDER — NITROFURANTOIN MONOHYD MACRO 100 MG PO CAPS: 100.0000 mg | ORAL_CAPSULE | Freq: Two times a day (BID) | ORAL | Status: DC

## 2012-11-16 NOTE — Telephone Encounter (Signed)
Macrobid sent to pt pharm per AVS.   LC CMA

## 2012-11-16 NOTE — Telephone Encounter (Signed)
Pt made aware of Rx being sent to pharm Pt asked about procedure being scheduled  Pt also stated she took macrobid everyday in the past PER AVS Macrobid x 1 tab BID x 3 days 2 RFs  Made pt aware I will consult w/ AVS.  LC CMA

## 2012-12-06 ENCOUNTER — Ambulatory Visit: Payer: 59

## 2013-01-15 ENCOUNTER — Ambulatory Visit: Payer: 59

## 2013-01-16 ENCOUNTER — Other Ambulatory Visit (HOSPITAL_COMMUNITY): Payer: Self-pay | Admitting: Obstetrics and Gynecology

## 2013-01-16 NOTE — H&P (Signed)
Debra [name] is a r42 YO female P 2-0-1-2 who presents for hysterectomy because of irregular bleeding.  For the past six months, the patient has had random spotting that lasts for 5 days requiring a tampon change every 2-3 hours.  She admits to crampy dyspareunia, pelvic pressure with urination but only minimal cramping with these bleeding episodes.  She denies any change in bowel movements or lower back pain and GC and chlamydia results were negative in December 2013.  In February 2014 her endometrial biopsy showed mid-secretory phase endometrium without hyperplasia, atypia or malignancy. A pelvic ultrasound at that same time revealed: anteflexed uterus-5.29 x 4.58 5.12 cm with right ovary-3.14 x 2.77 x 1.62 cm and left ovary-4.05 x 2.58 x 2.61 cm; no fluid was seen in the cul-de-sac.  The patient was advised of various management options to include:Mirena IUD, Oral Contraceptives, endometrial ablation and various other medical/surgical management options.  After thoughtful consideration, however, the patient who has completed her childbearing desires definitive therapy in the form of hysterectomy.   Past Medical History  OB History: G2P0202;  C-Section- 2003 & 2006  GYN History: menarche: 42 YO    LMP: see HPI    Contracepton: Tubal Sterilization; Has a history of HSV-2 and abnormal PAP smear in 1988 that was treated with cryotherapy and have been normal since;  Last PAP smear: 10/2012  Medical History: negative  Surgical History:  1989  Tonsilectomy & Adenoidectomy    2006  Tubal Sterilization Denies history of blood transfusions (refuses blood transfusions) and has severe nausea and vomiting with anesthesia  Family History:  Social History:  Divorced and employed with [employer]: Denies tobacco or illicit drug use but occasionally consumes alcohol   Medications:  Ibuprofen 800 mg every 8 hours as needed for pain Ultram 50 mg  every 4-6 hours as needed for pain  Allergies  Allergen  Reactions  . Codeine   . Tetracyclines & Related    Denies sensitivity to peanuts, shellfish, soy, latex or adhesives.   ROS: Admits to constipation but denies headache, vision changes, nasal congestion, dysphagia, tinnitus, dizziness, hoarseness, cough,  chest pain, shortness of breath, nausea, vomiting, diarrhea, urinary frequency, urgency  dysuria, hematuria, vaginitis symptoms, pelvic pain, swelling of joints,easy bruising,  myalgias, arthralgias, skin rashes, unexplained weight loss and except as is mentioned in the history of present illness, patient's review of systems is otherwise negative.  Physical Exam  Bp  100/68    P 70  R  14    T  98.7 degrees F orally    Weight 128    Height 5'6"    BMI    20.7  Neck: supple without masses or thyromegaly Lungs: clear to auscultation Heart: regular rate and rhythm Abdomen: soft, non-tender and no organomegaly Pelvic:EGBUS- wnl; vagina-normal rugae; uterus-normal size, cervix without lesions or motion tenderness; adnexae-no tenderness or masses Extremities:  no clubbing, cyanosis or edema   Assesment:  Irregular Bleeding   Disposition: The patient was given the indication for her procedure(s) along with the risks, which include but are not limited to, reaction to anesthesia, damage to adjacent organs, infection, excessive bleeding, formation of scar tissue, early menopause, pelvic prolapse and the possible need for an open abdominal incision.  Patient was informed  of the recent FDA  advisory stating that 1 in 350  patients undergoing surgery for fibroids, are  found to have an unsuspected malignancy (sarcoma).  If this is the case and morcellation is used there  is a risk that the  likelihood of survival will be decreased.  She was further advised that she will experience transient post operative facial edema, that her hospital stay is expected to be 0-2 days, she should be able to return to her usual activities within 2-3 weeks (except  intercourse to be delayed until 6 weeks) and that the robotic approach to her surgery requires more time to perform than an open abdominal approach.  Patient was given the Miralax bowel prep to be completed 24 hours prior to procedure.  She verbalized understanding of these risks and pre-operative instructions and has consented to proceed with a Robot Assisted Total Laparoscopic Hysterectomy with Bilateral Salpingectomy at [facility] [place],   February 05, 2013 at 12:30 p.m.   CSN# [id number]   Caylan Schifano J. Lowell Guitar, PA-C  for Dr. Crist Fat.Rivard

## 2013-01-21 ENCOUNTER — Encounter (HOSPITAL_COMMUNITY): Payer: Self-pay

## 2013-01-21 ENCOUNTER — Encounter (HOSPITAL_COMMUNITY)
Admission: RE | Admit: 2013-01-21 | Discharge: 2013-01-21 | Disposition: A | Discharge status: Home / Self Care | Payer: 59 | Source: Ambulatory Visit | Attending: Obstetrics and Gynecology | Admitting: Obstetrics and Gynecology

## 2013-01-21 DIAGNOSIS — Z01812 Encounter for preprocedural laboratory examination: Secondary | ICD-10-CM | POA: Insufficient documentation

## 2013-01-21 DIAGNOSIS — Z01818 Encounter for other preprocedural examination: Secondary | ICD-10-CM | POA: Insufficient documentation

## 2013-01-21 HISTORY — DX: Other specified postprocedural states: Z98.890

## 2013-01-21 HISTORY — DX: Other specified postprocedural states: R11.2

## 2013-01-21 HISTORY — DX: Other specified health status: Z78.9

## 2013-01-21 HISTORY — DX: Adverse effect of unspecified anesthetic, initial encounter: T41.45XA

## 2013-01-21 LAB — CBC
MCV: 91.1 fL (ref 78.0–100.0)
Platelets: 239 10*3/uL (ref 150–400)
RBC: 4.27 MIL/uL (ref 3.87–5.11)
RDW: 12.7 % (ref 11.5–15.5)
WBC: 4.7 10*3/uL (ref 4.0–10.5)

## 2013-01-21 LAB — SURGICAL PCR SCREEN: MRSA, PCR: NEGATIVE

## 2013-01-21 NOTE — Patient Instructions (Addendum)
Your procedure is scheduled on:02/05/13  Enter through the Main Entrance at :11am Pick up desk phone and dial [phone] and inform us of your arrival.  Please call [phone] if you have any problems the morning of surgery.  Remember: Do not eat after midnight:Monday Clear liquids ok until 0830 am on Tuesday   DO NOT wear jewelry, eye make-up, lipstick,body lotion, or dark fingernail polish.  If you are to be admitted after surgery, leave suitcase in car until your room has been assigned. Patients discharged on the day of surgery will not be allowed to drive home.

## 2013-02-05 ENCOUNTER — Encounter (HOSPITAL_COMMUNITY): Payer: Self-pay | Admitting: Anesthesiology

## 2013-02-05 ENCOUNTER — Ambulatory Visit (HOSPITAL_COMMUNITY)
Admission: RE | Admit: 2013-02-05 | Discharge: 2013-02-05 | Disposition: A | Discharge status: Home / Self Care | Payer: 59 | Source: Ambulatory Visit | Attending: Obstetrics and Gynecology | Admitting: Obstetrics and Gynecology

## 2013-02-05 ENCOUNTER — Encounter (HOSPITAL_COMMUNITY): Payer: Self-pay | Admitting: *Deleted

## 2013-02-05 ENCOUNTER — Ambulatory Visit (HOSPITAL_COMMUNITY): Payer: 59 | Admitting: Anesthesiology

## 2013-02-05 ENCOUNTER — Encounter (HOSPITAL_COMMUNITY)
Admission: RE | Disposition: A | Discharge status: Home / Self Care | Payer: Self-pay | Source: Ambulatory Visit | Attending: Obstetrics and Gynecology

## 2013-02-05 DIAGNOSIS — N854 Malposition of uterus: Secondary | ICD-10-CM | POA: Insufficient documentation

## 2013-02-05 DIAGNOSIS — N938 Other specified abnormal uterine and vaginal bleeding: Secondary | ICD-10-CM | POA: Insufficient documentation

## 2013-02-05 DIAGNOSIS — N949 Unspecified condition associated with female genital organs and menstrual cycle: Secondary | ICD-10-CM | POA: Insufficient documentation

## 2013-02-05 DIAGNOSIS — IMO0002 Reserved for concepts with insufficient information to code with codable children: Secondary | ICD-10-CM | POA: Insufficient documentation

## 2013-02-05 HISTORY — PX: BILATERAL SALPINGECTOMY: SHX5743

## 2013-02-05 HISTORY — PX: ROBOTIC ASSISTED TOTAL HYSTERECTOMY: SHX6085

## 2013-02-05 SURGERY — ROBOTIC ASSISTED TOTAL HYSTERECTOMY
Anesthesia: General | Site: Abdomen | Wound class: Clean Contaminated

## 2013-02-05 MED ORDER — KETOROLAC TROMETHAMINE 30 MG/ML IJ SOLN
INTRAMUSCULAR | Status: DC | PRN
  Administered 2013-02-05: 30 mg via INTRAVENOUS
  Administered 2013-02-05: 30 mg via INTRAMUSCULAR

## 2013-02-05 MED ORDER — LACTATED RINGERS IV SOLN
INTRAVENOUS | Status: DC
  Administered 2013-02-05 (×3): via INTRAVENOUS

## 2013-02-05 MED ORDER — DEXAMETHASONE SODIUM PHOSPHATE 10 MG/ML IJ SOLN
INTRAMUSCULAR | Status: AC
  Filled 2013-02-05: qty 1

## 2013-02-05 MED ORDER — SCOPOLAMINE 1 MG/3DAYS TD PT72
MEDICATED_PATCH | TRANSDERMAL | Status: AC
  Administered 2013-02-05: 1.5 mg via TRANSDERMAL
  Filled 2013-02-05: qty 1

## 2013-02-05 MED ORDER — ACETAMINOPHEN 10 MG/ML IV SOLN
1000.0000 mg | Freq: Once | INTRAVENOUS | Status: AC
  Administered 2013-02-05: 1000 mg via INTRAVENOUS

## 2013-02-05 MED ORDER — CEFOTETAN DISODIUM 2 G IJ SOLR
2.0000 g | Freq: Two times a day (BID) | INTRAMUSCULAR | Status: DC
  Administered 2013-02-05: 2 g via INTRAVENOUS
  Filled 2013-02-05 (×4): qty 2

## 2013-02-05 MED ORDER — MIDAZOLAM HCL 2 MG/2ML IJ SOLN
INTRAMUSCULAR | Status: AC
  Filled 2013-02-05: qty 2

## 2013-02-05 MED ORDER — GLYCOPYRROLATE 0.2 MG/ML IJ SOLN
INTRAMUSCULAR | Status: DC | PRN
  Administered 2013-02-05: .4 mg via INTRAVENOUS

## 2013-02-05 MED ORDER — HYDROMORPHONE HCL PF 1 MG/ML IJ SOLN: 0.5000 mg | Freq: Once | INTRAMUSCULAR | Status: AC

## 2013-02-05 MED ORDER — HYDROMORPHONE HCL PF 1 MG/ML IJ SOLN
INTRAMUSCULAR | Status: AC
  Administered 2013-02-05: 0.5 mg via INTRAVENOUS
  Filled 2013-02-05: qty 1

## 2013-02-05 MED ORDER — ONDANSETRON HCL 4 MG/2ML IJ SOLN
INTRAMUSCULAR | Status: AC
  Filled 2013-02-05: qty 2

## 2013-02-05 MED ORDER — FENTANYL CITRATE 0.05 MG/ML IJ SOLN
INTRAMUSCULAR | Status: AC
  Administered 2013-02-05: 50 ug via INTRAVENOUS
  Filled 2013-02-05: qty 2

## 2013-02-05 MED ORDER — ACETAMINOPHEN 10 MG/ML IV SOLN
INTRAVENOUS | Status: AC
  Administered 2013-02-05: 1000 mg via INTRAVENOUS
  Filled 2013-02-05: qty 100

## 2013-02-05 MED ORDER — LIDOCAINE HCL (CARDIAC) 20 MG/ML IV SOLN
INTRAVENOUS | Status: AC
  Filled 2013-02-05: qty 5

## 2013-02-05 MED ORDER — PROPOFOL 10 MG/ML IV EMUL
INTRAVENOUS | Status: AC
  Filled 2013-02-05: qty 20

## 2013-02-05 MED ORDER — ATROPINE SULFATE 0.4 MG/ML IJ SOLN
INTRAMUSCULAR | Status: AC
  Filled 2013-02-05: qty 1

## 2013-02-05 MED ORDER — ONDANSETRON HCL 4 MG/2ML IJ SOLN
4.0000 mg | Freq: Four times a day (QID) | INTRAMUSCULAR | Status: DC | PRN
  Administered 2013-02-05: 4 mg via INTRAVENOUS
  Filled 2013-02-05: qty 2

## 2013-02-05 MED ORDER — ONDANSETRON HCL 4 MG PO TABS: 4.0000 mg | ORAL_TABLET | Freq: Four times a day (QID) | ORAL | Status: DC | PRN

## 2013-02-05 MED ORDER — METOCLOPRAMIDE HCL 5 MG/ML IJ SOLN: 10.0000 mg | Freq: Once | INTRAMUSCULAR | Status: DC | PRN

## 2013-02-05 MED ORDER — LACTATED RINGERS IR SOLN
Status: DC | PRN
  Administered 2013-02-05: 3000 mL

## 2013-02-05 MED ORDER — HYDROMORPHONE HCL PF 1 MG/ML IJ SOLN
1.0000 mg | Freq: Once | INTRAMUSCULAR | Status: AC
  Administered 2013-02-05: 1 mg via INTRAVENOUS

## 2013-02-05 MED ORDER — ROPIVACAINE HCL 5 MG/ML IJ SOLN
INTRAMUSCULAR | Status: AC
  Filled 2013-02-05: qty 60

## 2013-02-05 MED ORDER — FENTANYL CITRATE 0.05 MG/ML IJ SOLN
INTRAMUSCULAR | Status: AC
  Filled 2013-02-05: qty 5

## 2013-02-05 MED ORDER — LIDOCAINE HCL (CARDIAC) 20 MG/ML IV SOLN
INTRAVENOUS | Status: DC | PRN
  Administered 2013-02-05: 60 mg via INTRAVENOUS

## 2013-02-05 MED ORDER — FENTANYL CITRATE 0.05 MG/ML IJ SOLN
25.0000 ug | INTRAMUSCULAR | Status: DC | PRN
  Administered 2013-02-05 (×2): 50 ug via INTRAVENOUS

## 2013-02-05 MED ORDER — ONDANSETRON HCL 4 MG/2ML IJ SOLN
INTRAMUSCULAR | Status: DC | PRN
  Administered 2013-02-05: 4 mg via INTRAVENOUS

## 2013-02-05 MED ORDER — BUPIVACAINE HCL (PF) 0.25 % IJ SOLN
INTRAMUSCULAR | Status: AC
  Filled 2013-02-05: qty 30

## 2013-02-05 MED ORDER — NEOSTIGMINE METHYLSULFATE 1 MG/ML IJ SOLN
INTRAMUSCULAR | Status: DC | PRN
  Administered 2013-02-05: 3 mg via INTRAVENOUS

## 2013-02-05 MED ORDER — PROPOFOL 10 MG/ML IV BOLUS
INTRAVENOUS | Status: DC | PRN
  Administered 2013-02-05: 50 mg via INTRAVENOUS
  Administered 2013-02-05: 200 mg via INTRAVENOUS
  Administered 2013-02-05: 50 mg via INTRAVENOUS

## 2013-02-05 MED ORDER — STERILE WATER FOR IRRIGATION IR SOLN
Status: DC | PRN
  Administered 2013-02-05: 1000 mL via INTRAVESICAL

## 2013-02-05 MED ORDER — ROCURONIUM BROMIDE 50 MG/5ML IV SOLN
INTRAVENOUS | Status: AC
  Filled 2013-02-05: qty 1

## 2013-02-05 MED ORDER — KETOROLAC TROMETHAMINE 30 MG/ML IJ SOLN
INTRAMUSCULAR | Status: AC
  Filled 2013-02-05: qty 1

## 2013-02-05 MED ORDER — IBUPROFEN 800 MG PO TABS: 800.0000 mg | ORAL_TABLET | Freq: Three times a day (TID) | ORAL | Status: DC | PRN

## 2013-02-05 MED ORDER — HYDROMORPHONE HCL PF 1 MG/ML IJ SOLN
INTRAMUSCULAR | Status: AC
  Filled 2013-02-05: qty 1

## 2013-02-05 MED ORDER — DEXAMETHASONE SODIUM PHOSPHATE 4 MG/ML IJ SOLN
INTRAMUSCULAR | Status: DC | PRN
  Administered 2013-02-05: 5 mg via INTRAVENOUS

## 2013-02-05 MED ORDER — MENTHOL 3 MG MT LOZG: 1.0000 | LOZENGE | OROMUCOSAL | Status: DC | PRN

## 2013-02-05 MED ORDER — PROMETHAZINE HCL 25 MG/ML IJ SOLN
12.5000 mg | Freq: Four times a day (QID) | INTRAMUSCULAR | Status: DC | PRN
  Administered 2013-02-05: 12.5 mg via INTRAVENOUS
  Filled 2013-02-05: qty 1

## 2013-02-05 MED ORDER — TEMAZEPAM 15 MG PO CAPS: 15.0000 mg | ORAL_CAPSULE | Freq: Every evening | ORAL | Status: DC | PRN

## 2013-02-05 MED ORDER — SCOPOLAMINE 1 MG/3DAYS TD PT72: 1.0000 | MEDICATED_PATCH | Freq: Once | TRANSDERMAL | Status: DC | PRN

## 2013-02-05 MED ORDER — GLYCOPYRROLATE 0.2 MG/ML IJ SOLN
INTRAMUSCULAR | Status: AC
  Filled 2013-02-05: qty 2

## 2013-02-05 MED ORDER — FENTANYL CITRATE 0.05 MG/ML IJ SOLN
INTRAMUSCULAR | Status: DC | PRN
  Administered 2013-02-05: 50 ug via INTRAVENOUS
  Administered 2013-02-05 (×2): 100 ug via INTRAVENOUS

## 2013-02-05 MED ORDER — ROPIVACAINE HCL 5 MG/ML IJ SOLN
INTRAMUSCULAR | Status: DC | PRN
  Administered 2013-02-05: 113 mL

## 2013-02-05 MED ORDER — ROCURONIUM BROMIDE 100 MG/10ML IV SOLN
INTRAVENOUS | Status: DC | PRN
  Administered 2013-02-05: 10 mg via INTRAVENOUS
  Administered 2013-02-05: 50 mg via INTRAVENOUS
  Administered 2013-02-05 (×2): 10 mg via INTRAVENOUS

## 2013-02-05 MED ORDER — MIDAZOLAM HCL 5 MG/5ML IJ SOLN
INTRAMUSCULAR | Status: DC | PRN
  Administered 2013-02-05: 2 mg via INTRAVENOUS

## 2013-02-05 MED ORDER — KETOROLAC TROMETHAMINE 30 MG/ML IJ SOLN: 15.0000 mg | Freq: Once | INTRAMUSCULAR | Status: DC | PRN

## 2013-02-05 MED ORDER — ATROPINE SULFATE 0.4 MG/ML IJ SOLN
INTRAMUSCULAR | Status: DC | PRN
  Administered 2013-02-05: .4 mg via INTRAVENOUS

## 2013-02-05 SURGICAL SUPPLY — 69 items
ADH SKN CLS APL DERMABOND .7 (GAUZE/BANDAGES/DRESSINGS) ×2
APL SKNCLS STERI-STRIP NONHPOA (GAUZE/BANDAGES/DRESSINGS) ×2
BAG URINE DRAINAGE (UROLOGICAL SUPPLIES) ×5 IMPLANT
BARRIER ADHS 3X4 INTERCEED (GAUZE/BANDAGES/DRESSINGS) ×3 IMPLANT
BENZOIN TINCTURE PRP APPL 2/3 (GAUZE/BANDAGES/DRESSINGS) ×3 IMPLANT
BRR ADH 4X3 ABS CNTRL BYND (GAUZE/BANDAGES/DRESSINGS) ×2
CATH FOLEY 3WAY  5CC 16FR (CATHETERS) ×1
CATH FOLEY 3WAY  5CC 18FR (CATHETERS) ×1
CATH FOLEY 3WAY 5CC 16FR (CATHETERS) ×4 IMPLANT
CATH FOLEY 3WAY 5CC 18FR (CATHETERS) ×2 IMPLANT
CHLORAPREP W/TINT 26ML (MISCELLANEOUS) ×3 IMPLANT
CLOTH BEACON ORANGE TIMEOUT ST (SAFETY) ×3 IMPLANT
CONT PATH 16OZ SNAP LID 3702 (MISCELLANEOUS) ×3 IMPLANT
CORD ACTIVE DISPOSABLE (ELECTRODE) ×1
CORD ELECTRO ACTIVE DISP (ELECTRODE) IMPLANT
CORDS BIPOLAR (ELECTRODE) IMPLANT
COVER MAYO STAND STRL (DRAPES) ×3 IMPLANT
COVER TABLE BACK 60X90 (DRAPES) ×6 IMPLANT
COVER TIP SHEARS 8 DVNC (MISCELLANEOUS) ×2 IMPLANT
COVER TIP SHEARS 8MM DA VINCI (MISCELLANEOUS) ×1
DECANTER SPIKE VIAL GLASS SM (MISCELLANEOUS) ×6 IMPLANT
DERMABOND ADVANCED (GAUZE/BANDAGES/DRESSINGS) ×1
DERMABOND ADVANCED .7 DNX12 (GAUZE/BANDAGES/DRESSINGS) ×2 IMPLANT
DRAPE HUG U DISPOSABLE (DRAPE) ×3 IMPLANT
DRAPE LG THREE QUARTER DISP (DRAPES) ×6 IMPLANT
DRAPE WARM FLUID 44X44 (DRAPE) ×3 IMPLANT
ELECT REM PT RETURN 9FT ADLT (ELECTROSURGICAL) ×3
ELECTRODE REM PT RTRN 9FT ADLT (ELECTROSURGICAL) ×2 IMPLANT
EVACUATOR SMOKE 8.L (FILTER) ×3 IMPLANT
GAUZE VASELINE 3X9 (GAUZE/BANDAGES/DRESSINGS) IMPLANT
GLOVE BIO SURGEON STRL SZ7 (GLOVE) ×3 IMPLANT
GLOVE BIOGEL PI IND STRL 7.0 (GLOVE) ×4 IMPLANT
GLOVE BIOGEL PI INDICATOR 7.0 (GLOVE) ×2
GLOVE ECLIPSE 6.5 STRL STRAW (GLOVE) ×9 IMPLANT
GOWN STRL REIN XL XLG (GOWN DISPOSABLE) ×18 IMPLANT
KIT ACCESSORY DA VINCI DISP (KITS) ×1
KIT ACCESSORY DVNC DISP (KITS) ×2 IMPLANT
LEGGING LITHOTOMY PAIR STRL (DRAPES) ×3 IMPLANT
NEEDLE HYPO 22GX1.5 SAFETY (NEEDLE) ×3 IMPLANT
OCCLUDER COLPOPNEUMO (BALLOONS) ×1 IMPLANT
PACK LAVH (CUSTOM PROCEDURE TRAY) ×3 IMPLANT
PAD PREP 24X48 CUFFED NSTRL (MISCELLANEOUS) ×6 IMPLANT
PLUG CATH AND CAP STER (CATHETERS) ×3 IMPLANT
PROTECTOR NERVE ULNAR (MISCELLANEOUS) ×6 IMPLANT
SET CYSTO W/LG BORE CLAMP LF (SET/KITS/TRAYS/PACK) ×1 IMPLANT
SET IRRIG TUBING LAPAROSCOPIC (IRRIGATION / IRRIGATOR) ×3 IMPLANT
SOLUTION ELECTROLUBE (MISCELLANEOUS) ×3 IMPLANT
STRIP CLOSURE SKIN 1/2X4 (GAUZE/BANDAGES/DRESSINGS) ×3 IMPLANT
SUT MNCRL AB 3-0 PS2 27 (SUTURE) ×2 IMPLANT
SUT VIC AB 0 CT1 27 (SUTURE) ×18
SUT VIC AB 0 CT1 27XBRD ANBCTR (SUTURE) ×4 IMPLANT
SUT VIC AB 0 CT1 27XBRD ANTBC (SUTURE) ×8 IMPLANT
SUT VICRYL 0 UR6 27IN ABS (SUTURE) ×6 IMPLANT
SYR 50ML LL SCALE MARK (SYRINGE) ×4 IMPLANT
SYSTEM CONVERTIBLE TROCAR (TROCAR) ×3 IMPLANT
TIP RUMI ORANGE 6.7MMX12CM (TIP) IMPLANT
TIP UTERINE 5.1X6CM LAV DISP (MISCELLANEOUS) IMPLANT
TIP UTERINE 6.7X10CM GRN DISP (MISCELLANEOUS) IMPLANT
TIP UTERINE 6.7X6CM WHT DISP (MISCELLANEOUS) IMPLANT
TIP UTERINE 6.7X8CM BLUE DISP (MISCELLANEOUS) ×1 IMPLANT
TOWEL OR 17X24 6PK STRL BLUE (TOWEL DISPOSABLE) ×9 IMPLANT
TROCAR 12M 150ML BLUNT (TROCAR) ×3 IMPLANT
TROCAR DISP BLADELESS 8 DVNC (TROCAR) ×2 IMPLANT
TROCAR DISP BLADELESS 8MM (TROCAR) ×1
TROCAR XCEL 12X100 BLDLESS (ENDOMECHANICALS) ×3 IMPLANT
TROCAR XCEL NON-BLD 5MMX100MML (ENDOMECHANICALS) ×1 IMPLANT
TUBING FILTER THERMOFLATOR (ELECTROSURGICAL) ×3 IMPLANT
WARMER LAPAROSCOPE (MISCELLANEOUS) ×3 IMPLANT
WATER STERILE IRR 1000ML POUR (IV SOLUTION) ×9 IMPLANT

## 2013-02-05 NOTE — Anesthesia Preprocedure Evaluation (Signed)
Anesthesia Evaluation  Patient identified by MRN, date of birth, ID band Patient awake    Reviewed: Allergy & Precautions, H&P , NPO status , Patient's Chart, lab work & pertinent test results, reviewed documented beta blocker date and time   History of Anesthesia Complications (+) PONV  Airway Mallampati: II TM Distance: >3 FB Neck ROM: full    Dental  (+) Teeth Intact   Pulmonary neg pulmonary ROS,  breath sounds clear to auscultation  Pulmonary exam normal       Cardiovascular Exercise Tolerance: Good negative cardio ROS  Rhythm:regular Rate:Normal     Neuro/Psych negative neurological ROS  negative psych ROS   GI/Hepatic negative GI ROS, Neg liver ROS,   Endo/Other  negative endocrine ROS  Renal/GU negative Renal ROS  Female GU complaint     Musculoskeletal   Abdominal   Peds  Hematology Reports she bruises easily   Anesthesia Other Findings   Reproductive/Obstetrics negative OB ROS                           Anesthesia Physical Anesthesia Plan  ASA: I  Anesthesia Plan: General ETT   Post-op Pain Management:    Induction:   Airway Management Planned:   Additional Equipment:   Intra-op Plan:   Post-operative Plan:   Informed Consent: I have reviewed the patients History and Physical, chart, labs and discussed the procedure including the risks, benefits and alternatives for the proposed anesthesia with the patient or authorized representative who has indicated his/her understanding and acceptance.   Dental Advisory Given  Plan Discussed with: CRNA and Surgeon  Anesthesia Plan Comments:         Anesthesia Quick Evaluation

## 2013-02-05 NOTE — Progress Notes (Signed)
Patient discharged home with family member. Stable condition. No home health equipment ordered at discharge. Taken down via wheelchair to main entrance by Luisa Dago, NT.

## 2013-02-05 NOTE — Anesthesia Postprocedure Evaluation (Signed)
  Anesthesia Post Note  Patient: Debra [name]  Procedure(s) Performed: Procedure(s) (LRB): ROBOTIC ASSISTED TOTAL HYSTERECTOMY (N/A) BILATERAL SALPINGECTOMY (Bilateral)  Anesthesia type: GA  Patient location: PACU  Post pain: Pain level controlled  Post assessment: Post-op Vital signs reviewed  Last Vitals:  Filed Vitals:   02/05/13 1102  BP: 107/57  Pulse: 76  Temp: 37 C  Resp: 16    Post vital signs: Reviewed  Level of consciousness: sedated  Complications: No apparent anesthesia complications

## 2013-02-05 NOTE — H&P (Signed)
Debra [name] is a r42 YO female P 2-0-1-2 who presents for hysterectomy because of irregular bleeding. For the past six months, the patient has had random spotting that lasts for 5 days requiring a tampon change every 2-3 hours. She admits to crampy dyspareunia, pelvic pressure with urination but only minimal cramping with these bleeding episodes. She denies any change in bowel movements or lower back pain and GC and chlamydia results were negative in December 2013. In February 2014 her endometrial biopsy showed mid-secretory phase endometrium without hyperplasia, atypia or malignancy. A pelvic ultrasound at that same time revealed: anteflexed uterus-5.29 x 4.58 5.12 cm with right ovary-3.14 x 2.77 x 1.62 cm and left ovary-4.05 x 2.58 x 2.61 cm; no fluid was seen in the cul-de-sac. The patient was advised of various management options to include:Mirena IUD, Oral Contraceptives, endometrial ablation and various other medical/surgical management options. After thoughtful consideration, however, the patient who has completed her childbearing desires definitive therapy in the form of hysterectomy.   Past Medical History   OB History: G2P0202; C-Section- 2003 & 2006   GYN History: menarche: 42 YO LMP: see HPI Contracepton: Tubal Sterilization; Has a history of HSV-2 and abnormal PAP smear in 1988 that was treated with cryotherapy and have been normal since; Last PAP smear: 10/2012   Medical History: negative   Surgical History: 1989 Tonsilectomy & Adenoidectomy 2006 Tubal Sterilization  Denies history of blood transfusions (refuses blood transfusions) and has severe nausea and vomiting with anesthesia   Family History: Negative  Social History: Divorced and employed with [employer]: Denies tobacco or illicit drug use but occasionally consumes alcohol   Medications:   Ibuprofen 800 mg every 8 hours as needed for pain  Ultram 50 mg every 4-6 hours as needed for pain  Allergies   Allergen  Reactions    .  Codeine    .  Tetracyclines & Related     Denies sensitivity to peanuts, shellfish, soy, latex or adhesives.   ROS: Admits to constipation but denies headache, vision changes, nasal congestion, dysphagia, tinnitus, dizziness, hoarseness, cough, chest pain, shortness of breath, nausea, vomiting, diarrhea, urinary frequency, urgency dysuria, hematuria, vaginitis symptoms, pelvic pain, swelling of joints,easy bruising, myalgias, arthralgias, skin rashes, unexplained weight loss and except as is mentioned in the history of present illness, patient's review of systems is otherwise negative.   Physical Exam  Bp 100/68 P 70 R 14 T 98.7 degrees F orally Weight 128 Height 5'6" BMI 20.7   Neck: supple without masses or thyromegaly  Lungs: clear to auscultation  Heart: regular rate and rhythm  Abdomen: soft, non-tender and no organomegaly  Pelvic:EGBUS- wnl; vagina-normal rugae; uterus-normal size, cervix without lesions or motion tenderness; adnexae-no tenderness or masses  Extremities: no clubbing, cyanosis or edema   Assesment: Irregular Bleeding   Disposition: The patient was given the indication for her procedure(s) along with the risks, which include but are not limited to, reaction to anesthesia, damage to adjacent organs, infection, excessive bleeding, formation of scar tissue, early menopause, pelvic prolapse and the possible need for an open abdominal incision. Patient was informed of the recent FDA advisory stating that 1 in 350 patients undergoing surgery for fibroids, are found to have an unsuspected malignancy (sarcoma). If this is the case and morcellation is used there is a risk that the likelihood of survival will be decreased. She was further advised that she will experience transient post operative facial edema, that her hospital stay is expected to be  0-2 days, she should be able to return to her usual activities within 2-3 weeks (except intercourse to be delayed until 6 weeks) and  that the robotic approach to her surgery requires more time to perform than an open abdominal approach. Patient was given the Miralax bowel prep to be completed 24 hours prior to procedure. She verbalized understanding of these risks and pre-operative instructions and has consented to proceed with a Robot Assisted Total Laparoscopic Hysterectomy with Bilateral Salpingectomy at [facility] [place], February 05, 2013 at 12:30 p.m.   CSN# [id number]  Aimy Sweeting J. Lowell Guitar, PA-C for Dr. Crist Fat.Rivard

## 2013-02-05 NOTE — Transfer of Care (Signed)
Immediate Anesthesia Transfer of Care Note  Patient: Debra [name]  Procedure(s) Performed: Procedure(s): ROBOTIC ASSISTED TOTAL HYSTERECTOMY (N/A) BILATERAL SALPINGECTOMY (Bilateral)  Patient Location: PACU  Anesthesia Type:General  Level of Consciousness: awake, alert  and oriented  Airway & Oxygen Therapy: Patient Spontanous Breathing and Patient connected to nasal cannula oxygen  Post-op Assessment: Report given to PACU RN and Post -op Vital signs reviewed and stable  Post vital signs: stable  Complications: No apparent anesthesia complications

## 2013-02-05 NOTE — Discharge Summary (Signed)
Physician Discharge Summary  Patient ID: Debra [name] MRN: [id number] DOB/AGE: May 18, 1971 41 y.o.  Admit date: 02/05/2013 Discharge date: 02/05/2013   Discharge Diagnoses:  Irregular Bleeding Active Problems:   * No active hospital problems. *   Operation: Robot Assisted Total Laparoscopic Hysterectomy with Bilateral Salpingectomy   Discharged Condition: Good  Hospital Course: On the date of admission the patient underwent the aforementioned procedures and tolerated all procedures well.  Post operative course was unremarkable with the patient resuming bowel and bladder function on the evening of the procedure and was therefore deemed ready for discharge home.   Disposition: 01-Home or Self Care  Discharge Medications:    Medication List    TAKE these medications       ibuprofen 800 MG tablet  Commonly known as:  ADVIL,MOTRIN  Take 800 mg by mouth every 8 (eight) hours as needed for pain.      Dilaudid 2 mg every 4 hours as needed for pain Phenergan 12.5 mg every 6 hours as needed for nausea  Follow-up: Dr. Estanislado Pandy 02/18/2013 at 9:10 a.m.  Signed: Kerianne Gurr,PA-C 02/05/2013, 4:47 PM

## 2013-02-05 NOTE — Op Note (Signed)
Preoperative diagnosis: Refractory dysfunctional uterine bleeding, status post 2 cesarean sections  Postoperative diagnosis: Same with pelvic adhesions  Anesthesia: Gen.  Anesthesiologist: Dr. Dana Allan  Procedure: Robotically assisted total hysterectomy with lysis of adhesion and bilateral salpingectomy  Surgeon: Dr. Dois Davenport Antaeus Karel  Assistant: Henreitta Leber P.A.-C.  Estimated blood loss: 50 cc  Procedure:  After being informed of the planned procedure with possible complications including bleeding, infection, injury to other organs, possibility of laparotomy, possibility of requiring a supracervical hysterectomy, expected hospitals they in recovery, informed consent is obtained and patient is taken to or #7. She is placed in the lithotomy position on a sticky mattress with beanbag with both arms padded and tucked on each side and bilateral knee-high sequential compressive devices. She is given general anesthesia with endotracheal intubation without any complication. She is prepped and draped in a sterile fashion.  Pelvic exam reveals an anteverted uterus normal in size and shape and 2 normal adnexa.  A weighted speculum is inserted in the vagina and the anterior lip of the cervix is grasped with a tenaculum forcep. We perform a paracervical block using ropivacaine then 0.5% diluted 1-1 with normal saline, 10 cc. The cervix was easily dilated using Hegar dilator until #25. The uterus is sounded at 8 cm. A RUMI intrauterine manipulator is easily positioned in the uterus with a 3.0 KOH ring and a vaginal occluder. The intrauterine manipulator is balloon is inflated with 10 cc of saline. A three-way Foley catheter is then inserted in the bladder.  We infiltrate the umbilical area with 10 cc of ropivacaine 0.5% diluted 1 in 1 with normal saline and perform a semi-elliptical incision which is brought down sharply to the fascia. The fascia is identified and grasped with Coker forceps and  infiltrated with ropivacaine 0.5%. It is incised with Mayo scissors and peritoneum is entered bluntly. A pursestring suture of 0 Vicryl is placed on the fascia and a 10 mm Hassan trocar is easily inserted in the abdominal cavity and held in place with a Purstring suture. This allows for easy insufflation of the pneumoperitoneum using warm to CO2 at a maximum pressure of 15 mm of mercury.  Observation: Anterior cul-de-sac is normal. Posterior cul-de-sac is normal. Uterus is normal in size and shape. Both ovaries are normal. Both tubes show signs of previous tubal ligation but otherwise appeared normal. We see adhesions between the colon and the right abdominal wall that are grade 1 in 2. Liver is normal. The gallbladder is normal. Appendix is seen later in the procedure and is also normal.  The patient is repositioned in reverse Trendelenburg and 60 cc of ropivacaine 0.5% diluted 1 in 1 in normal saline is sent into the pelvis for a pelvic washings. Trocar placement is then decided. We position one 8 mm robotic trocar on the left, 1 8mm robotic trocar on the right and a 5 mm right-sided patient's side assistant trocar. All trocar sites are infiltrated with 10 cc of our ropivacaine dilution making sure the infiltrate subfascial with a peritoneal bubble. All trochars are inserted under direct visualization. The robot is docked on the patient's left side. A monopolar scissor is inserted in arm #1 and a PK gyrus forcep is inserted in arm #2. Preparation and docking is completed in 30 minutes.  Starting on the right side we removed the tubal remnant by cauterizing the mesosalpinx and sectioning it. The tube is removed by the right-sided 8mm trocar. We perform exactly the same for the left tubal remnant.  Hemostasis is adequate. We then proceed with cauterization of the utero-ovarian ligament on the right which is then sectioned. We then cauterized the round ligament on the right  which is then sectioned. This gives  [place] entry into the retroperitoneal space. Right ureter is easily visualize. The posterior sheath of the broad ligament is dissected sharply until it meets the KOH ring. The anterior sheath of the broad ligament is dissected sharply all the way across the lower uterine segment. The bladder is filled with 200 cc of saline to easily identified the vesicouterine junction. We then proceed with sharp and blunt dissection of the bladder flap all the way to 2 cm below the KOH ring. Moving to the left side, we cauterize the utero-ovarian ligament and sectioned it. He cauterize the round ligament and sectioned it. Again the ureter is easily visualized on the left pelvic wall away from our site of dissection. The anterior sheath of the broad ligament is sharply incised meeting its counterpart. The posterior sheath of the broad ligament is dissected sharply all the way to the KOH ring. The vascular bundle is then skeletonized and with pressure applied on [facility] Ring and the ureter under direct visitation, it is cauterize using PK gyrus forcep. We then skeletonized the vascular bundle on the right. With pressure on the KOH ring in the right ureter under direct position we can then cauterized the vascular bundle with PK gyrus forcep and sectioned it. The vaginal occluder is inflated allowing [place] to perform a colpotomy using an open monopolar scissors and guided by the KOH ring until the uterus is completely released. The uterus is delivered vaginally.  Instruments are modified for a suture cut in arm #1 and a long tip forcep and arm #2. 4 needles are then dropped in the abdominal cavity via the 10 mm umbilical trocar and rapidly recovered and tagged to the anterior abdominal wall. We can then proceed with closure of the vaginal cuff using figure-of-eight stitches of 0 Vicryl. Hemostasis is adequate. We irrigated profusely with warm saline and systematically verified hemostasis on all pedicles which is deemed adequate. Both  ureters are easily visualized with normal peristalsis. A sheet of Interceed is then position on the vaginal cuff.  The robot is undocked and all instruments are removed. Changing to a 5 mm camera inserted through the left 8mm trocar, we proceed with removal of all 4 needles through the 10 mm umbilical trocar. Again hemostasis is verified and adequate. All trochars are removed under direct visitation after evacuation of the pneumoperitoneum.  The fascia of the umbilical incision is closed with the previously placed pursestring suture of 0 Vicryl. A figure-of-eight stitch of 0 Vicryl is needed for satisfactory closure. The skin of all incisions is then closed with subcuticular suture of 3-0 Monocryl and Dermabond.  A speculum is inserted in the vagina to confirm an adequate closure of the vaginal cuff with normal hemostasis.  Instrument and sponge count is complete x2. Estimated blood loss is 50 cc. The procedure is very well tolerated by the patient is taken to recovery room in a well and stable condition.  Specimen: Uterus and 2 segments of tube sent to pathology

## 2013-02-06 ENCOUNTER — Encounter (HOSPITAL_COMMUNITY): Payer: Self-pay | Admitting: Obstetrics and Gynecology

## 2013-05-20 ENCOUNTER — Ambulatory Visit: Payer: 59

## 2013-05-30 ENCOUNTER — Ambulatory Visit
Admission: RE | Admit: 2013-05-30 | Discharge: 2013-05-30 | Disposition: A | Discharge status: Home / Self Care | Payer: 59 | Source: Ambulatory Visit | Attending: Obstetrics and Gynecology | Admitting: Obstetrics and Gynecology

## 2013-05-30 DIAGNOSIS — Z1231 Encounter for screening mammogram for malignant neoplasm of breast: Secondary | ICD-10-CM

## 2013-07-11 ENCOUNTER — Other Ambulatory Visit: Payer: Self-pay

## 2014-06-20 ENCOUNTER — Other Ambulatory Visit: Payer: Self-pay

## 2014-06-20 DIAGNOSIS — Z1231 Encounter for screening mammogram for malignant neoplasm of breast: Secondary | ICD-10-CM

## 2014-07-02 ENCOUNTER — Ambulatory Visit
Admission: RE | Admit: 2014-07-02 | Discharge: 2014-07-02 | Disposition: A | Discharge status: Home / Self Care | Payer: 59 | Source: Ambulatory Visit

## 2014-07-02 DIAGNOSIS — Z1231 Encounter for screening mammogram for malignant neoplasm of breast: Secondary | ICD-10-CM

## 2014-07-07 ENCOUNTER — Encounter (HOSPITAL_COMMUNITY): Payer: Self-pay | Admitting: Obstetrics and Gynecology

## 2014-07-09 ENCOUNTER — Ambulatory Visit (INDEPENDENT_AMBULATORY_CARE_PROVIDER_SITE_OTHER): Payer: 59 | Admitting: Podiatrist

## 2014-07-09 VITALS — BP 120/76 | HR 75 | Resp 16 | Ht 66.0 in | Wt 130.0 lb

## 2014-07-09 DIAGNOSIS — L603 Nail dystrophy: Secondary | ICD-10-CM

## 2014-07-09 NOTE — Patient Instructions (Signed)
Onychomycosis/Fungal Toenails  WHAT IS IT? An infection that lies within the keratin of your nail plate that is caused by a fungus.  WHY ME? Fungal infections affect all ages, sexes, races, and creeds.  There may be many factors that predispose you to a fungal infection such as age, coexisting medical conditions such as diabetes, or an autoimmune disease; stress, medications, fatigue, genetics, etc.  Bottom line: fungus thrives in a warm, moist environment and your shoes offer such a location.  IS IT CONTAGIOUS? Theoretically, yes.  You do not want to share shoes, nail clippers or files with someone who has fungal toenails.  Walking around barefoot in the same room or sleeping in the same bed is unlikely to transfer the organism.  It is important to realize, however, that fungus can spread easily from one nail to the next on the same foot.  HOW DO WE TREAT THIS?  There are several ways to treat this condition.  Treatment may depend on many factors such as age, medications, pregnancy, liver and kidney conditions, etc.  It is best to ask your doctor which options are available to you.  1. No treatment.   Unlike many other medical concerns, you can live with this condition.  However for many people this can be a painful condition and may lead to ingrown toenails or a bacterial infection.  It is recommended that you keep the nails cut short to help reduce the amount of fungal nail. 2. Topical treatment.  These range from herbal remedies to prescription strength nail lacquers.  About 40-50% effective, topicals require twice daily application for approximately 9 to 12 months or until an entirely new nail has grown out.  The most effective topicals are medical grade medications available through physicians offices. 3. Oral antifungal medications.  With an 80-90% cure rate, the most common oral medication requires 3 to 4 months of therapy and stays in your system for a year as the new nail grows out.  Oral  antifungal medications do require blood work to make sure it is a safe drug for you.  A liver function panel will be performed prior to starting the medication and after the first month of treatment.  It is important to have the blood work performed to avoid any harmful side effects.  In general, this medication safe but blood work is required. 4. Laser Therapy.  This treatment is performed by applying a specialized laser to the affected nail plate.  This therapy is noninvasive, fast, and non-painful.  It is not covered by insurance and is therefore, out of pocket. 5. Permanent Nail Avulsion.  Removing the entire nail so that a new nail will not grow back. 

## 2014-07-09 NOTE — Progress Notes (Signed)
   Subjective:    Patient ID: Debra [name], female    DOB: 03/19/1971, 43 y.o.   MRN: [id number]  HPI Comments: Both great toenails are discolored and thick. Been going on for a while . No treatment  For the nails      Review of Systems  All other systems reviewed and are negative.      Objective:   Physical Exam  Patient is awake, alert, and oriented x 3.  In no acute distress.  Vascular status is intact with palpable pedal pulses at 2/4 DP and PT bilateral and capillary refill time within normal limits. Neurological sensation is also intact bilaterally via Semmes Weinstein monofilament at 5/5 sites. Light touch, vibratory sensation, Achilles tendon reflex is intact. Dermatological exam reveals skin color, turger and texture as normal. No open lesions present.  Musculature intact with dorsiflexion, plantarflexion, inversion, eversion.  All digital toenails have discoloration present. There appears to be some etching on the dorsal surface of all toenails well. Mycotic appearance is noted. Slight subungual debris is also present.    Assessment & Plan:  mycotic toenails  Plan: Considering all digital toenails are involved and would like to isolate the pathogen prior to starting therapy. We will call her with the results of the culture. Most likely an oral and topical medication will be prescribed. We'll wait on the culture results before deciding which ones. Patient demonstrates an understanding of this conversation she'll call back if she hasn't heard from us within 2 weeks.

## 2014-08-08 ENCOUNTER — Telehealth: Payer: Self-pay | Admitting: *Deleted

## 2014-08-08 DIAGNOSIS — Z79899 Other long term (current) drug therapy: Secondary | ICD-10-CM

## 2014-08-08 MED ORDER — ITRACONAZOLE 200 MG PO TABS: 200.0000 mg | ORAL_TABLET | Freq: Every day | ORAL | Status: AC

## 2014-08-08 NOTE — Telephone Encounter (Signed)
Returned call-Dr. Irving ShowsEgerton just received report yesterday. [facility] Onmel x 28 tabs one a day x 2 refills.

## 2014-08-08 NOTE — Telephone Encounter (Signed)
Patient called stating that she hasn't got her results from the nail specimen.

## 2014-08-11 ENCOUNTER — Encounter: Payer: Self-pay | Admitting: Podiatrist

## 2017-07-24 DIAGNOSIS — Z90710 Acquired absence of both cervix and uterus: Secondary | ICD-10-CM | POA: Insufficient documentation

## 2019-12-16 DIAGNOSIS — L309 Dermatitis, unspecified: Secondary | ICD-10-CM | POA: Insufficient documentation

## 2019-12-16 DIAGNOSIS — N202 Calculus of kidney with calculus of ureter: Secondary | ICD-10-CM | POA: Insufficient documentation

## 2019-12-16 DIAGNOSIS — G43909 Migraine, unspecified, not intractable, without status migrainosus: Secondary | ICD-10-CM | POA: Insufficient documentation

## 2020-06-20 ENCOUNTER — Other Ambulatory Visit: Payer: Self-pay

## 2020-06-20 ENCOUNTER — Emergency Department (INDEPENDENT_AMBULATORY_CARE_PROVIDER_SITE_OTHER)
Admission: RE | Admit: 2020-06-20 | Discharge: 2020-06-20 | Disposition: A | Discharge status: Home / Self Care | Payer: BC Managed Care – PPO | Source: Ambulatory Visit

## 2020-06-20 ENCOUNTER — Ambulatory Visit: Payer: Self-pay

## 2020-06-20 VITALS — BP 131/81 | HR 73 | Temp 98.0°F | Resp 15

## 2020-06-20 DIAGNOSIS — N3001 Acute cystitis with hematuria: Secondary | ICD-10-CM | POA: Diagnosis not present

## 2020-06-20 DIAGNOSIS — N390 Urinary tract infection, site not specified: Secondary | ICD-10-CM | POA: Insufficient documentation

## 2020-06-20 LAB — POCT URINALYSIS DIP (MANUAL ENTRY)
Bilirubin, UA: NEGATIVE
Glucose, UA: NEGATIVE mg/dL
Ketones, POC UA: NEGATIVE mg/dL
Nitrite, UA: NEGATIVE
Protein Ur, POC: NEGATIVE mg/dL
Spec Grav, UA: 1.01 (ref 1.010–1.025)
Urobilinogen, UA: 0.2 E.U./dL
pH, UA: 6.5 (ref 5.0–8.0)

## 2020-06-20 MED ORDER — CEPHALEXIN 500 MG PO CAPS: 500.0000 mg | ORAL_CAPSULE | Freq: Four times a day (QID) | ORAL | 0 refills | Status: DC

## 2020-06-20 NOTE — Discharge Instructions (Signed)
°  Please take your antibiotic as prescribed. A urine culture has been sent to check the severity of your urinary infection and to determine if you are on the most appropriate antibiotic. The results should come back within 2-3 days. You will only be notified if a medication change is indicated.  Please follow up with family medicine or urology if not improving within 3-4 days, sooner if symptoms worsening.

## 2020-06-20 NOTE — ED Provider Notes (Signed)
Debra [name]    CSN: [id number] Arrival date & time: 06/20/20  0930      History   Chief Complaint Chief Complaint  Patient presents with  . Dysuria    HPI Debra [name] is a 49 y.o. female.   HPI  Debra [name] is a 49 y.o. female presenting to UC with c/o dysuria, bladder pain and pressure, chills and low back pain for 2 days.  Hx of chronic recurrent UTIs. She dropped off a urine sample at her GYN office yesterday but was not called in any medication.  Denies fever n/v/d.    Past Medical History:  Diagnosis Date  . Complication of anesthesia 2006   ponv  . Medical history non-contributory   . PONV (postoperative nausea and vomiting)     Patient Active Problem List   Diagnosis Date Noted  . Recurrent urinary tract infection 06/20/2020  . 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 Eczema 12/16/2019  . Migraine 12/16/2019  . History of hysterectomy 07/24/2017  . Pelvic pain in female 10/09/2012  . Irregular bleeding 10/09/2012    Past Surgical History:  Procedure Laterality Date  . BILATERAL SALPINGECTOMY Bilateral 02/05/2013   Procedure: BILATERAL SALPINGECTOMY;  Surgeon: Esmeralda Arthur, MD;  Location: [facility];  Service: Gynecology;  Laterality: Bilateral;  . CESAREAN SECTION     x2  . ROBOTIC ASSISTED TOTAL HYSTERECTOMY N/A 02/05/2013   Procedure: ROBOTIC ASSISTED TOTAL HYSTERECTOMY;  Surgeon: Esmeralda Arthur, MD;  Location: [facility];  Service: Gynecology;  Laterality: N/A;    OB History    Gravida  2   Para  2   Term  0   Preterm  2   AB  0   Living  2     SAB  0   TAB  0   Ectopic  0   Multiple  0   Live Births               Home Medications    Prior to Admission medications   Medication Sig Start Date End Date Taking? Authorizing Provider  cephALEXin (KEFLEX) 500 MG capsule Take 1 capsule (500 mg total) by mouth 4 (four) times daily. 06/20/20   Lurene Shadow, PA-C  ibuprofen (ADVIL,MOTRIN) 800 MG tablet Take 800  mg by mouth every 8 (eight) hours as needed for pain.    [provider]  Itraconazole (ONMEL) 200 MG TABS Take 200 mg by mouth daily. 08/08/14   Delories Heinz, DPM    Family History Family History  Problem Relation Age of Onset  . Hypertension Mother   . Diabetes Mother   . Hypertension Father     Social History Social History   Tobacco Use  . Smoking status: Never Smoker  . Smokeless tobacco: Never Used  Substance Use Topics  . Alcohol use: Yes    Comment: socially   . Drug use: No     Allergies   Codeine and Tetracyclines & related   Review of Systems Review of Systems  Constitutional: Positive for chills. Negative for fever.  Gastrointestinal: Positive for abdominal pain. Negative for diarrhea, nausea and vomiting.  Genitourinary: Positive for dysuria, flank pain, frequency and urgency. Negative for hematuria.  Musculoskeletal: Positive for back pain.  Neurological: Negative for dizziness and headaches.     Physical Exam Triage Vital Signs ED Triage Vitals  Enc Vitals Group     BP 06/20/20 0951 131/81     Pulse Rate 06/20/20  0951 73     Resp 06/20/20 0951 15     Temp 06/20/20 0951 98 F (36.7 C)     Temp Source 06/20/20 0951 Oral     SpO2 06/20/20 0951 99 %     Weight --      Height --      Head Circumference --      Peak Flow --      Pain Score 06/20/20 0952 8     Pain Loc --      Pain Edu? --      Excl. in GC? --    No data found.  Updated Vital Signs BP 131/81 (BP Location: Right Arm)   Pulse 73   Temp 98 F (36.7 C) (Oral)   Resp 15   LMP 01/19/2013   SpO2 99%   Visual Acuity Right Eye Distance:   Left Eye Distance:   Bilateral Distance:    Right Eye Near:   Left Eye Near:    Bilateral Near:     Physical Exam Vitals and nursing note reviewed.  Constitutional:      General: She is not in acute distress.    Appearance: Normal appearance. She is well-developed. She is not ill-appearing, toxic-appearing or  diaphoretic.  HENT:     Head: Normocephalic and atraumatic.  Cardiovascular:     Rate and Rhythm: Normal rate and regular rhythm.  Pulmonary:     Effort: Pulmonary effort is normal. No respiratory distress.     Breath sounds: Normal breath sounds.  Abdominal:     General: There is no distension.     Palpations: Abdomen is soft.     Tenderness: There is no abdominal tenderness. There is no right CVA tenderness or left CVA tenderness.  Musculoskeletal:        General: Normal range of motion.     Cervical back: Normal range of motion.  Skin:    General: Skin is warm and dry.  Neurological:     Mental Status: She is alert and oriented to person, place, and time.  Psychiatric:        Behavior: Behavior normal.      UC Treatments / Results  Labs (all labs ordered are listed, but only abnormal results are displayed) Labs Reviewed  POCT URINALYSIS DIP (MANUAL ENTRY) - Abnormal; Notable for the following components:      Result Value   Color, UA other (*)    Clarity, UA cloudy (*)    Blood, UA moderate (*)    Leukocytes, UA Large (3+) (*)    All other components within normal limits  URINE CULTURE    EKG   Radiology No results found.  Procedures Procedures (including critical [name] time)  Medications Ordered in UC Medications - No data to display  Initial Impression / Assessment and Plan / UC Course  I have reviewed the triage vital signs and the nursing notes.  Pertinent labs & imaging results that were available during my [name] of the patient were reviewed by me and considered in my medical decision making (see chart for details).     Hx and UA c/w UTI Culture sent Will start pt on keflex, has done well with this in the past F/u with PCP or urology Discussed symptoms that warrant emergent [name] in the ED. AVS given  Final Clinical Impressions(s) / UC Diagnoses   Final diagnoses:  Acute cystitis with hematuria     Discharge Instructions      Please take  your antibiotic as prescribed. A urine culture has been sent to check the severity of your urinary infection and to determine if you are on the most appropriate antibiotic. The results should come back within 2-3 days. You will only be notified if a medication change is indicated.  Please follow up with family medicine or urology if not improving within 3-4 days, sooner if symptoms worsening.      ED Prescriptions    Medication Sig Dispense Auth. Provider   cephALEXin (KEFLEX) 500 MG capsule Take 1 capsule (500 mg total) by mouth 4 (four) times daily. 20 capsule Lurene Shadow, PA-C     PDMP not reviewed this encounter.   Lurene Shadow, [place] 06/21/20 1515

## 2020-06-20 NOTE — ED Triage Notes (Signed)
Hx of chronic UTI- 4 this year - gets scripts thru her OB - has a urine culture pending  Labs were done on Friday, but did not see a doctor Currently taking URO-MP at 0300 along w/ 3 ibuprofen  Bladder pain w/ chills, low back pain  Denies fever  COVID Vaccine [facility] [employer] [facility]

## 2020-06-22 LAB — URINE CULTURE
MICRO NUMBER:: 11082137
SPECIMEN QUALITY:: ADEQUATE

## 2020-06-24 ENCOUNTER — Telehealth: Payer: Self-pay | Admitting: Family Medicine

## 2020-06-24 ENCOUNTER — Telehealth: Payer: Self-pay

## 2020-06-24 MED ORDER — AMOXICILLIN-POT CLAVULANATE 875-125 MG PO TABS: 1.0000 | ORAL_TABLET | Freq: Two times a day (BID) | ORAL | 0 refills | Status: AC

## 2020-06-24 NOTE — Telephone Encounter (Signed)
Pt called about lab results. Said she was somewhat improved but still having some sxs. Per Jerrilyn Cairo, switch to augmentin. Rx sent, pt notified.

## 2020-06-24 NOTE — Telephone Encounter (Signed)
RE: Voicemail message regarding urine culture Notify patient I have sent augmentin to the pharmacy if her symptoms have not improved with discontinue Keflex and start Augmentin. If symptoms are improving, complete Keflex and disregard Augmentin

## 2021-08-30 ENCOUNTER — Emergency Department (INDEPENDENT_AMBULATORY_CARE_PROVIDER_SITE_OTHER)
Admission: RE | Admit: 2021-08-30 | Discharge: 2021-08-30 | Disposition: A | Discharge status: Home / Self Care | Payer: BC Managed Care – PPO | Source: Ambulatory Visit

## 2021-08-30 ENCOUNTER — Other Ambulatory Visit: Payer: Self-pay

## 2021-08-30 VITALS — BP 138/84 | HR 73 | Temp 98.6°F | Resp 20 | Ht 66.0 in | Wt 140.0 lb

## 2021-08-30 DIAGNOSIS — B029 Zoster without complications: Secondary | ICD-10-CM | POA: Diagnosis not present

## 2021-08-30 MED ORDER — VALACYCLOVIR HCL 1 G PO TABS: 1000.0000 mg | ORAL_TABLET | Freq: Three times a day (TID) | ORAL | 0 refills | Status: AC

## 2021-08-30 NOTE — Discharge Instructions (Addendum)
Advised patient to take medication as directed with food to completion.  Encouraged patient to increase daily water intake while taking these medications. 

## 2021-08-30 NOTE — ED Provider Notes (Signed)
[facility]    CSN: [id number] Arrival date & time: 08/30/21  1634      History   Chief Complaint Chief Complaint  Patient presents with   Headache    Headache, back pain, nausea and chills. X1 week    HPI Debra [name] is a 50 y.o. female.   HPI 50 year old female presents with headache, back pain, nausea and chills x 1 week.  PMH significant for migraine.  Patient is most concerned with a rash of her back (x 3 days) which is painful.  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   Eczema 12/16/2019   Migraine 12/16/2019   History of hysterectomy 07/24/2017   Pelvic pain in female 10/09/2012   Irregular bleeding 10/09/2012    Past Surgical History:  Procedure Laterality Date   BILATERAL SALPINGECTOMY Bilateral 02/05/2013   Procedure: BILATERAL SALPINGECTOMY;  Surgeon: Esmeralda Arthur, MD;  Location: [facility];  Service: Gynecology;  Laterality: Bilateral;   CESAREAN SECTION     x2   ROBOTIC ASSISTED TOTAL HYSTERECTOMY N/A 02/05/2013   Procedure: ROBOTIC ASSISTED TOTAL HYSTERECTOMY;  Surgeon: Esmeralda Arthur, MD;  Location: [facility];  Service: Gynecology;  Laterality: N/A;    OB History     Gravida  2   Para  2   Term  0   Preterm  2   AB  0   Living         SAB  0   IAB  0   Ectopic  0   Multiple      Live Births               Home Medications    Prior to Admission medications   Medication Sig Start Date End Date Taking? Authorizing Provider  nitrofurantoin, macrocrystal-monohydrate, (MACROBID) 100 MG capsule nitrofurantoin monohydrate/macrocrystals 100 mg capsule   Yes [provider]  valACYclovir (VALTREX) 1000 MG tablet Take 1 tablet (1,000 mg total) by  mouth 3 (three) times daily. 08/30/21  Yes Trevor Iha, FNP  amoxicillin-clavulanate (AUGMENTIN) 875-125 MG tablet Take 1 tablet by mouth 2 (two) times daily. 06/24/20   Bing Neighbors, FNP  ibuprofen (ADVIL,MOTRIN) 800 MG tablet Take 800 mg by mouth every 8 (eight) hours as needed for pain.    [provider]  Itraconazole (ONMEL) 200 MG TABS Take 200 mg by mouth daily. 08/08/14   Delories Heinz, DPM    Family History Family History  Problem Relation Age of Onset   Hypertension Mother    Diabetes Mother    Hypertension Father     Social History Social History   Tobacco Use   Smoking status: Never   Smokeless tobacco: Never  Substance Use Topics   Alcohol use: Yes    Comment: socially    Drug use: No     Allergies   Codeine and Tetracyclines & related   Review of Systems Review of Systems  Skin:  Positive for rash.  All other systems reviewed and are negative.   Physical Exam Triage Vital Signs ED Triage Vitals  Enc Vitals Group     BP 08/30/21 1722 138/84     Pulse Rate 08/30/21 1722 73  Resp 08/30/21 1722 20     Temp 08/30/21 1722 98.6 F (37 C)     Temp Source 08/30/21 1722 Oral     SpO2 08/30/21 1722 99 %     Weight 08/30/21 1719 140 lb (63.5 kg)     Height 08/30/21 1719 5\' 6"  (1.676 m)     Head Circumference --      Peak Flow --      Pain Score 08/30/21 1719 10     Pain Loc --      Pain Edu? --      Excl. in GC? --    No data found.  Updated Vital Signs BP 138/84 (BP Location: Right Arm)    Pulse 73    Temp 98.6 F (37 C) (Oral)    Resp 20    Ht 5\' 6"  (1.676 m)    Wt 140 lb (63.5 kg)    LMP 01/19/2013    SpO2 99%    BMI 22.60 kg/m   Physical Exam Vitals and nursing note reviewed.  Constitutional:      Appearance: Normal appearance. She is normal weight.  HENT:     Head: Normocephalic and atraumatic.     Mouth/Throat:     Mouth: Mucous membranes are moist.     Pharynx: Oropharynx is clear.  Eyes:     Extraocular  Movements: Extraocular movements intact.     Conjunctiva/sclera: Conjunctivae normal.     Pupils: Pupils are equal, round, and reactive to light.  Cardiovascular:     Rate and Rhythm: Normal rate and regular rhythm.     Pulses: Normal pulses.     Heart sounds: Normal heart sounds.  Pulmonary:     Effort: Pulmonary effort is normal.     Breath sounds: Normal breath sounds.  Musculoskeletal:     Cervical back: Normal range of motion and neck supple.  Skin:    General: Skin is warm and dry.     Comments: Right sided lower back: Painful grouped erythematous vesicular lesions noted  Neurological:     General: No focal deficit present.     Mental Status: She is alert and oriented to person, place, and time. Mental status is at baseline.     UC Treatments / Results  Labs (all labs ordered are listed, but only abnormal results are displayed) Labs Reviewed - No data to display  EKG   Radiology No results found.  Procedures Procedures (including critical care time)  Medications Ordered in UC Medications - No data to display  Initial Impression / Assessment and Plan / UC Course  I have reviewed the triage vital signs and the nursing notes.  Pertinent labs & imaging results that were available during my care of the patient were reviewed by me and considered in my medical decision making (see chart for details).     MDM: 1.  Herpes zoster without complication-Rx'd Valtrex. Advised patient to take medication as directed with food to completion.  Encouraged patient to increase daily water intake while taking these medications.  Patient discharged home, hemodynamically stable. Final Clinical Impressions(s) / UC Diagnoses   Final diagnoses:  Herpes zoster without complication     Discharge Instructions      Advised patient to take medication as directed with food to completion.  Encouraged patient to increase daily water intake while taking these medications.     ED  Prescriptions     Medication Sig Dispense Auth. Provider   valACYclovir (VALTREX) 1000 MG tablet  Take 1 tablet (1,000 mg total) by mouth 3 (three) times daily. 21 tablet Trevor Iha, FNP      PDMP not reviewed this encounter.   Trevor Iha, FNP 08/30/21 1810

## 2021-08-30 NOTE — ED Triage Notes (Signed)
Pt states that she has a headache, back pain, nausea and chills. X1 week  Pt states that had a negative covid test on 12/25.  Pt states that she is vaccinated for covid. Pt states that she hasn't had flu vaccine.

## 2021-09-02 ENCOUNTER — Ambulatory Visit: Payer: Self-pay
